# Patient Record
Sex: Female | Born: 1935 | Race: White | Hispanic: No | Smoking: Never smoker
Health system: Southern US, Community
[De-identification: ages and names within clinical notes are randomized; demographics above are authoritative.]

## PROBLEM LIST (undated history)

## (undated) DIAGNOSIS — E119 Type 2 diabetes mellitus without complications: Secondary | ICD-10-CM

---

## 2020-05-29 ENCOUNTER — Other Ambulatory Visit: Payer: Self-pay

## 2020-05-29 ENCOUNTER — Emergency Department (HOSPITAL_COMMUNITY)
Admission: EM | Admit: 2020-05-29 | Discharge: 2020-05-29 | Disposition: A | Discharge status: Home / Self Care | Payer: Medicare Other | Attending: Emergency Medicine | Admitting: Emergency Medicine

## 2020-05-29 ENCOUNTER — Emergency Department (HOSPITAL_COMMUNITY): Payer: Medicare Other

## 2020-05-29 ENCOUNTER — Encounter (HOSPITAL_COMMUNITY): Payer: Self-pay | Admitting: Emergency Medicine

## 2020-05-29 DIAGNOSIS — Z7984 Long term (current) use of oral hypoglycemic drugs: Secondary | ICD-10-CM | POA: Diagnosis not present

## 2020-05-29 DIAGNOSIS — R55 Syncope and collapse: Secondary | ICD-10-CM | POA: Diagnosis present

## 2020-05-29 DIAGNOSIS — E119 Type 2 diabetes mellitus without complications: Secondary | ICD-10-CM | POA: Diagnosis not present

## 2020-05-29 DIAGNOSIS — E871 Hypo-osmolality and hyponatremia: Secondary | ICD-10-CM | POA: Insufficient documentation

## 2020-05-29 HISTORY — DX: Type 2 diabetes mellitus without complications: E11.9

## 2020-05-29 LAB — COMPREHENSIVE METABOLIC PANEL
ALT: 20 U/L (ref 0–44)
AST: 30 U/L (ref 15–41)
Albumin: 3.2 g/dL — ABNORMAL LOW (ref 3.5–5.0)
Alkaline Phosphatase: 48 U/L (ref 38–126)
Anion gap: 9 (ref 5–15)
BUN: 20 mg/dL (ref 8–23)
CO2: 21 mmol/L — ABNORMAL LOW (ref 22–32)
Calcium: 8.1 mg/dL — ABNORMAL LOW (ref 8.9–10.3)
Chloride: 97 mmol/L — ABNORMAL LOW (ref 98–111)
Creatinine, Ser: 1.11 mg/dL — ABNORMAL HIGH (ref 0.44–1.00)
GFR, Estimated: 49 mL/min — ABNORMAL LOW (ref >60)
Glucose, Bld: 187 mg/dL — ABNORMAL HIGH (ref 70–99)
Potassium: 3.9 mmol/L (ref 3.5–5.1)
Sodium: 127 mmol/L — ABNORMAL LOW (ref 135–145)
Total Bilirubin: <0.1 mg/dL — ABNORMAL LOW (ref 0.3–1.2)
Total Protein: 6.7 g/dL (ref 6.5–8.1)

## 2020-05-29 LAB — CBC WITH DIFFERENTIAL/PLATELET
Abs Immature Granulocytes: 0 10*3/uL (ref 0.00–0.07)
Basophils Absolute: 0 10*3/uL (ref 0.0–0.1)
Basophils Relative: 0 %
Eosinophils Absolute: 0 10*3/uL (ref 0.0–0.5)
Eosinophils Relative: 0 %
HCT: 36.8 % (ref 36.0–46.0)
Hemoglobin: 12.6 g/dL (ref 12.0–15.0)
Immature Granulocytes: 0 %
Lymphocytes Relative: 16 %
Lymphs Abs: 0.6 10*3/uL — ABNORMAL LOW (ref 0.7–4.0)
MCH: 29.5 pg (ref 26.0–34.0)
MCHC: 34.2 g/dL (ref 30.0–36.0)
MCV: 86.2 fL (ref 80.0–100.0)
Monocytes Absolute: 0.2 10*3/uL (ref 0.1–1.0)
Monocytes Relative: 7 %
Neutro Abs: 2.7 10*3/uL (ref 1.7–7.7)
Neutrophils Relative %: 77 %
Platelets: 170 10*3/uL (ref 150–400)
RBC: 4.27 MIL/uL (ref 3.87–5.11)
RDW: 12.8 % (ref 11.5–15.5)
WBC: 3.5 10*3/uL — ABNORMAL LOW (ref 4.0–10.5)
nRBC: 0 % (ref 0.0–0.2)

## 2020-05-29 LAB — URINALYSIS, ROUTINE W REFLEX MICROSCOPIC
Bilirubin Urine: NEGATIVE
Glucose, UA: 50 mg/dL — AB
Ketones, ur: NEGATIVE mg/dL
Leukocytes,Ua: NEGATIVE
Nitrite: NEGATIVE
Protein, ur: NEGATIVE mg/dL
Specific Gravity, Urine: 1.011 (ref 1.005–1.030)
pH: 6 (ref 5.0–8.0)

## 2020-05-29 LAB — TROPONIN I (HIGH SENSITIVITY)
Troponin I (High Sensitivity): 5 ng/L (ref <18)
Troponin I (High Sensitivity): 7 ng/L (ref <18)

## 2020-05-29 MED ORDER — SODIUM CHLORIDE 0.9 % IV SOLN: INTRAVENOUS | Status: DC

## 2020-05-29 MED ORDER — SODIUM CHLORIDE 0.9 % IV BOLUS
500.0000 mL | Freq: Once | INTRAVENOUS | Status: AC
  Administered 2020-05-29: 500 mL via INTRAVENOUS
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
## 2020-05-29 NOTE — ED Notes (Signed)
Sonya Jones [phone]

## 2020-05-29 NOTE — ED Notes (Signed)
Pt. Refused the covid swab.

## 2020-05-29 NOTE — Discharge Instructions (Addendum)
Work-up for the near syncopal episode without any significant findings other than some mild hyponatremia.  This probably got corrected with the IV fluids here.  COVID testing is done and is pending results should be on MyChart tomorrow.  Would recommend that she have follow-up with her primary care doctor in about a week to have her basic metabolic panel repeated.  Blood sugar up a little bit today but nothing of real concern.  It was 180.  Return for any new or worse symptoms.  Return for passing out.

## 2020-05-29 NOTE — ED Triage Notes (Signed)
Pt from home via [facility]. Pt reports "almost passing out prior to using the bathroom." Per family pt has "another episode after sing the bathroom." Per Ems pt HR was 42 en oute and BP in the 80s.

## 2021-12-03 IMAGING — DX DG CHEST 1V PORT
1 series · 1 of 1 positions shown · non-contrast
Comparison: None.

CLINICAL DATA: Near syncope

EXAM:
PORTABLE CHEST 1 VIEW

[chest ap]
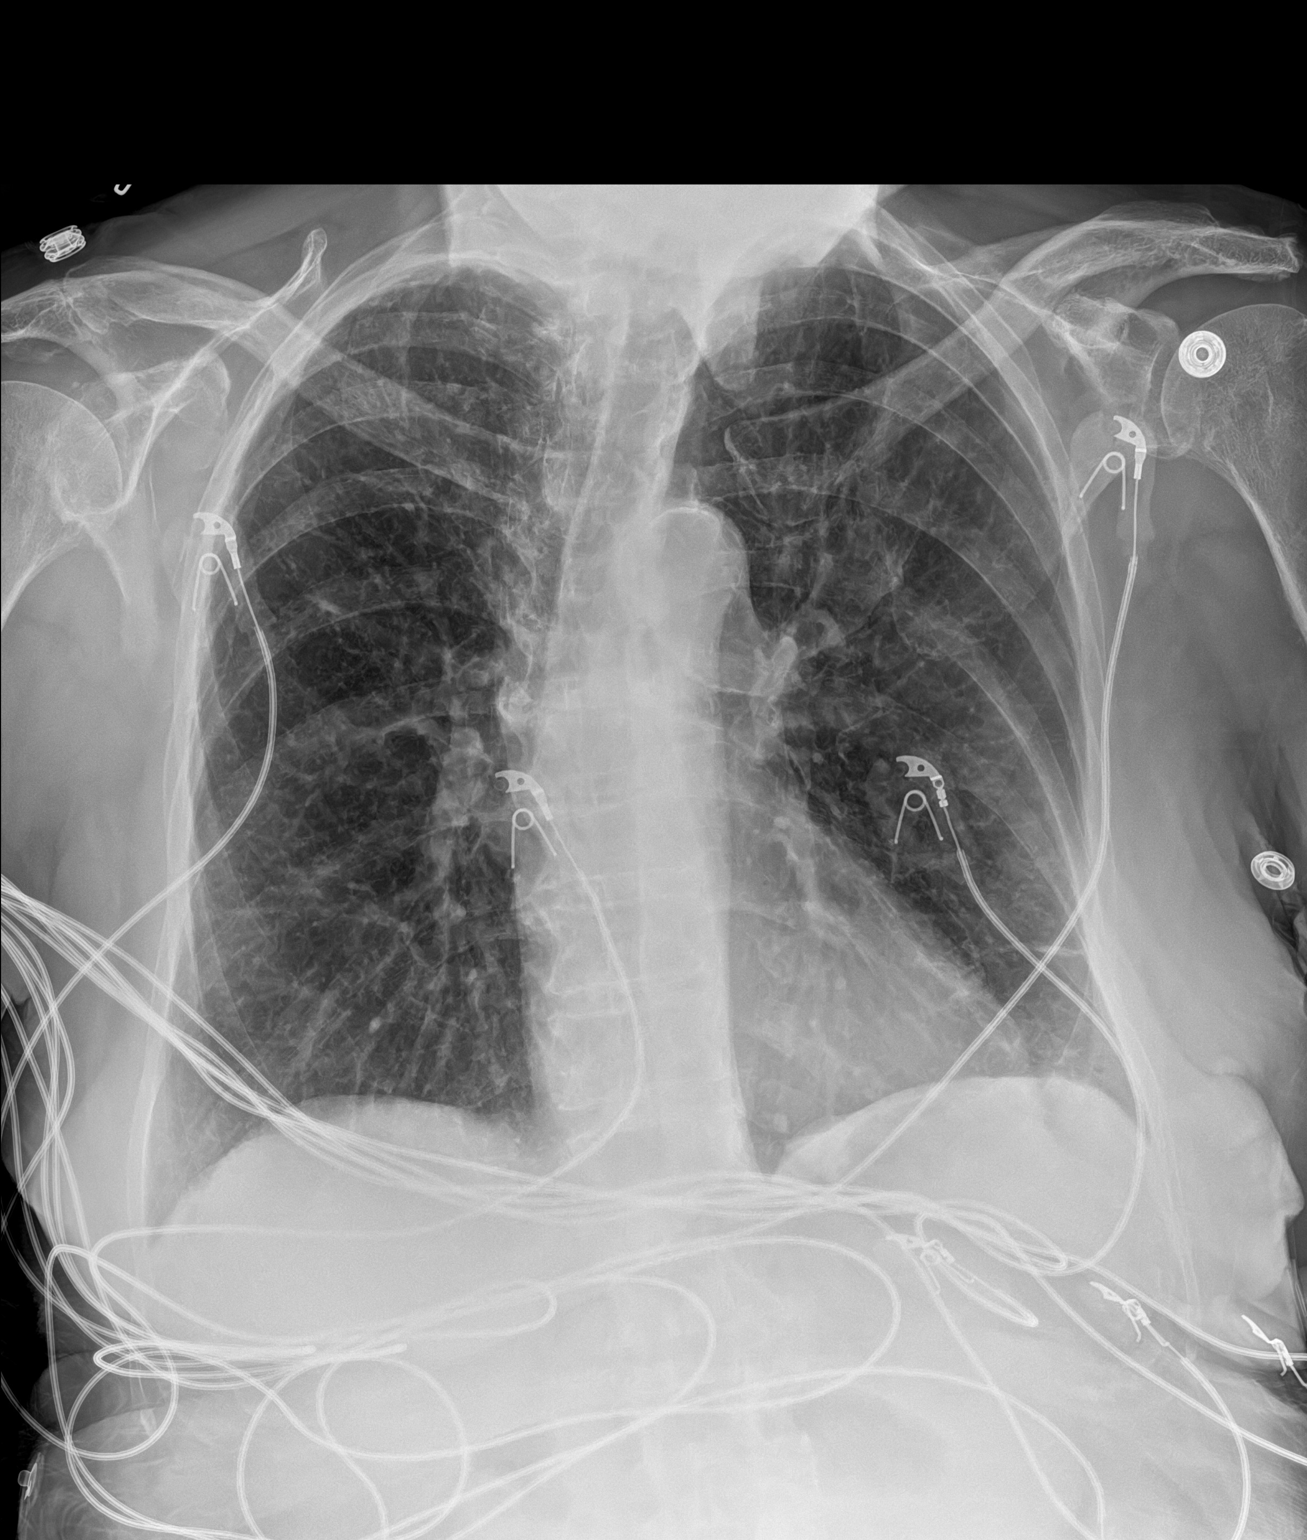

[1 of 1 positions shown; findings below may reference images not displayed]

FINDINGS: No focal opacity or pleural effusion. Normal cardiomediastinal
silhouette with aortic atherosclerosis. No pneumothorax.
IMPRESSION: No active disease.

## 2022-02-26 ENCOUNTER — Emergency Department: Payer: Medicare Other

## 2022-02-26 ENCOUNTER — Inpatient Hospital Stay
Admission: EM | Admit: 2022-02-26 | Discharge: 2022-03-03 | DRG: 481 | Disposition: A | Discharge status: Skilled Nursing Facility | Payer: Medicare Other | Attending: Internal Medicine | Admitting: Internal Medicine

## 2022-02-26 DIAGNOSIS — D62 Acute posthemorrhagic anemia: Secondary | ICD-10-CM | POA: Diagnosis not present

## 2022-02-26 DIAGNOSIS — F0150 Vascular dementia without behavioral disturbance: Secondary | ICD-10-CM | POA: Diagnosis present

## 2022-02-26 DIAGNOSIS — W1830XA Fall on same level, unspecified, initial encounter: Secondary | ICD-10-CM | POA: Diagnosis present

## 2022-02-26 DIAGNOSIS — I10 Essential (primary) hypertension: Secondary | ICD-10-CM | POA: Diagnosis present

## 2022-02-26 DIAGNOSIS — Z20822 Contact with and (suspected) exposure to covid-19: Secondary | ICD-10-CM | POA: Diagnosis present

## 2022-02-26 DIAGNOSIS — R339 Retention of urine, unspecified: Secondary | ICD-10-CM | POA: Diagnosis not present

## 2022-02-26 DIAGNOSIS — Z7901 Long term (current) use of anticoagulants: Secondary | ICD-10-CM

## 2022-02-26 DIAGNOSIS — S72001A Fracture of unspecified part of neck of right femur, initial encounter for closed fracture: Secondary | ICD-10-CM | POA: Diagnosis present

## 2022-02-26 DIAGNOSIS — F0390 Unspecified dementia without behavioral disturbance: Secondary | ICD-10-CM

## 2022-02-26 DIAGNOSIS — Y92129 Unspecified place in nursing home as the place of occurrence of the external cause: Secondary | ICD-10-CM

## 2022-02-26 DIAGNOSIS — Z79899 Other long term (current) drug therapy: Secondary | ICD-10-CM | POA: Diagnosis not present

## 2022-02-26 DIAGNOSIS — M810 Age-related osteoporosis without current pathological fracture: Secondary | ICD-10-CM | POA: Diagnosis present

## 2022-02-26 DIAGNOSIS — Z86718 Personal history of other venous thrombosis and embolism: Secondary | ICD-10-CM

## 2022-02-26 DIAGNOSIS — E1165 Type 2 diabetes mellitus with hyperglycemia: Secondary | ICD-10-CM

## 2022-02-26 DIAGNOSIS — S72011A Unspecified intracapsular fracture of right femur, initial encounter for closed fracture: Secondary | ICD-10-CM | POA: Diagnosis present

## 2022-02-26 DIAGNOSIS — W19XXXA Unspecified fall, initial encounter: Secondary | ICD-10-CM

## 2022-02-26 DIAGNOSIS — L8990 Pressure ulcer of unspecified site, unspecified stage: Secondary | ICD-10-CM | POA: Insufficient documentation

## 2022-02-26 LAB — URINALYSIS, ROUTINE W REFLEX MICROSCOPIC
Bacteria, UA: NONE SEEN
Bilirubin Urine: NEGATIVE
Glucose, UA: >=500 mg/dL — AB
Ketones, ur: NEGATIVE mg/dL
Leukocytes,Ua: NEGATIVE
Nitrite: NEGATIVE
Protein, ur: NEGATIVE mg/dL
Specific Gravity, Urine: 1.021 (ref 1.005–1.030)
pH: 5 (ref 5.0–8.0)

## 2022-02-26 LAB — PROTIME-INR
INR: 1.9 — ABNORMAL HIGH (ref 0.8–1.2)
Prothrombin Time: 21.5 seconds — ABNORMAL HIGH (ref 11.4–15.2)

## 2022-02-26 LAB — CBC WITH DIFFERENTIAL/PLATELET
Abs Immature Granulocytes: 0.02 10*3/uL (ref 0.00–0.07)
Basophils Absolute: 0.1 10*3/uL (ref 0.0–0.1)
Basophils Relative: 1 %
Eosinophils Absolute: 0.1 10*3/uL (ref 0.0–0.5)
Eosinophils Relative: 1 %
HCT: 39.4 % (ref 36.0–46.0)
Hemoglobin: 12.9 g/dL (ref 12.0–15.0)
Immature Granulocytes: 0 %
Lymphocytes Relative: 8 %
Lymphs Abs: 0.7 10*3/uL (ref 0.7–4.0)
MCH: 29.7 pg (ref 26.0–34.0)
MCHC: 32.7 g/dL (ref 30.0–36.0)
MCV: 90.8 fL (ref 80.0–100.0)
Monocytes Absolute: 0.6 10*3/uL (ref 0.1–1.0)
Monocytes Relative: 6 %
Neutro Abs: 7.9 10*3/uL — ABNORMAL HIGH (ref 1.7–7.7)
Neutrophils Relative %: 84 %
Platelets: 221 10*3/uL (ref 150–400)
RBC: 4.34 MIL/uL (ref 3.87–5.11)
RDW: 12.6 % (ref 11.5–15.5)
WBC: 9.4 10*3/uL (ref 4.0–10.5)
nRBC: 0 % (ref 0.0–0.2)

## 2022-02-26 LAB — BASIC METABOLIC PANEL
Anion gap: 11 (ref 5–15)
BUN: 32 mg/dL — ABNORMAL HIGH (ref 8–23)
CO2: 20 mmol/L — ABNORMAL LOW (ref 22–32)
Calcium: 9.2 mg/dL (ref 8.9–10.3)
Chloride: 106 mmol/L (ref 98–111)
Creatinine, Ser: 1.26 mg/dL — ABNORMAL HIGH (ref 0.44–1.00)
GFR, Estimated: 41 mL/min — ABNORMAL LOW (ref >60)
Glucose, Bld: 343 mg/dL — ABNORMAL HIGH (ref 70–99)
Potassium: 3.6 mmol/L (ref 3.5–5.1)
Sodium: 137 mmol/L (ref 135–145)

## 2022-02-26 LAB — RESP PANEL BY RT-PCR (FLU A&B, COVID) ARPGX2
Influenza A by PCR: NEGATIVE
Influenza B by PCR: NEGATIVE
SARS Coronavirus 2 by RT PCR: NEGATIVE

## 2022-02-26 LAB — TYPE AND SCREEN

## 2022-02-26 MED ORDER — TRAZODONE HCL 50 MG PO TABS
25.0000 mg | ORAL_TABLET | Freq: Every evening | ORAL | Status: DC | PRN
  Administered 2022-02-27 – 2022-03-02 (×4): 25 mg via ORAL
  Filled 2022-02-26 (×4): qty 1

## 2022-02-26 MED ORDER — ACETAMINOPHEN 325 MG PO TABS: 650.0000 mg | ORAL_TABLET | ORAL | Status: DC | PRN

## 2022-02-26 MED ORDER — CHLORHEXIDINE GLUCONATE 4 % EX LIQD: Freq: Once | CUTANEOUS | Status: AC

## 2022-02-26 MED ORDER — DONEPEZIL HCL 5 MG PO TABS
5.0000 mg | ORAL_TABLET | Freq: Every day | ORAL | Status: DC
  Administered 2022-02-26 – 2022-03-02 (×5): 5 mg via ORAL
  Filled 2022-02-26 (×5): qty 1

## 2022-02-26 MED ORDER — LORAZEPAM 1 MG PO TABS: 1.0000 mg | ORAL_TABLET | Freq: Once | ORAL | Status: DC

## 2022-02-26 MED ORDER — MORPHINE SULFATE (PF) 2 MG/ML IV SOLN: 0.5000 mg | INTRAVENOUS | Status: DC | PRN

## 2022-02-26 MED ORDER — HYDROCODONE-ACETAMINOPHEN 5-325 MG PO TABS: 1.0000 | ORAL_TABLET | Freq: Four times a day (QID) | ORAL | Status: DC | PRN

## 2022-02-26 MED ORDER — METOPROLOL TARTRATE 25 MG PO TABS
25.0000 mg | ORAL_TABLET | Freq: Two times a day (BID) | ORAL | Status: DC
  Administered 2022-02-26 – 2022-03-03 (×9): 25 mg via ORAL
  Filled 2022-02-26 (×10): qty 1

## 2022-02-26 MED ORDER — MEMANTINE HCL 5 MG PO TABS
5.0000 mg | ORAL_TABLET | Freq: Two times a day (BID) | ORAL | Status: DC
  Administered 2022-02-26 – 2022-03-03 (×9): 5 mg via ORAL
  Filled 2022-02-26 (×10): qty 1

## 2022-02-26 MED ORDER — POTASSIUM CHLORIDE IN NACL 20-0.9 MEQ/L-% IV SOLN
INTRAVENOUS | Status: DC
  Filled 2022-02-26 (×2): qty 1000

## 2022-02-26 MED ORDER — SODIUM CHLORIDE 0.9 % IV SOLN: INTRAVENOUS | Status: DC

## 2022-02-26 MED ORDER — MAGNESIUM HYDROXIDE 400 MG/5ML PO SUSP: 30.0000 mL | Freq: Every day | ORAL | Status: DC | PRN

## 2022-02-26 MED ORDER — HYDROCODONE-ACETAMINOPHEN 5-325 MG PO TABS: 1.0000 | ORAL_TABLET | ORAL | Status: DC | PRN

## 2022-02-26 MED ORDER — ONDANSETRON HCL 4 MG/2ML IJ SOLN: 4.0000 mg | INTRAMUSCULAR | Status: DC | PRN

## 2022-02-26 MED ORDER — CEFAZOLIN SODIUM-DEXTROSE 2-4 GM/100ML-% IV SOLN
2.0000 g | INTRAVENOUS | Status: AC
  Administered 2022-02-27: 2 g via INTRAVENOUS
  Filled 2022-02-26: qty 100

## 2022-02-26 MED ORDER — LORAZEPAM 2 MG/ML IJ SOLN
0.5000 mg | Freq: Once | INTRAMUSCULAR | Status: AC
  Administered 2022-02-26: 0.5 mg via INTRAVENOUS
  Filled 2022-02-26: qty 1

## 2022-02-26 NOTE — Assessment & Plan Note (Signed)
-   We will have to temporarily hold off Eliquis for now for expected operative intervention tomorrow. - We will defer resumption of Eliquis to Dr. Ammie Ferrier discretion, especially given recent onset of acute DVT.Marland Kitchen

## 2022-02-26 NOTE — Assessment & Plan Note (Signed)
We will continue- We will continue her antihypertensives.

## 2022-02-26 NOTE — ED Provider Notes (Addendum)
----------------------------------------- 8:24 PM on 02/26/2022 -----------------------------------------  Blood pressure (!) 151/65, pulse 80, temperature 98.6 F (37 C), temperature source Oral, resp. rate 18, SpO2 95 %.  Assuming care from Dr. Griffin Dakin, PA-C/NP-C.  In short, Sonya Jones is a 88 y.o. female with a chief complaint of Hip Injury (Resident from [facility] [place] was brought in by EMS for an acute RIGHT femoral neck fracture (history obtained from EMS because patient has vascular dementia); Patient was bending over to pick up a book when she fell this morning; Imaging was obtained at the SNF which showed the fracture) .  Refer to the original H&P for additional details.  The current plan of care is to await CT results to confirm hip fracture and admit to the hospital service for surgical intervention in the morning.  ____________________________________________    ED Results / Procedures / Treatments   Labs (all labs ordered are listed, but only abnormal results are displayed) Labs Reviewed  CBC WITH DIFFERENTIAL/PLATELET - Abnormal; Notable for the following components:      Result Value   Neutro Abs 7.9 (*)    All other components within normal limits  BASIC METABOLIC PANEL - Abnormal; Notable for the following components:   CO2 20 (*)    Glucose, Bld 343 (*)    BUN 32 (*)    Creatinine, Ser 1.26 (*)    GFR, Estimated 41 (*)    All other components within normal limits  PROTIME-INR - Abnormal; Notable for the following components:   Prothrombin Time 21.5 (*)    INR 1.9 (*)    All other components within normal limits  RESP PANEL BY RT-PCR (FLU A&B, COVID) [id number]  URINALYSIS, ROUTINE W REFLEX MICROSCOPIC     EKG     RADIOLOGY  I personally viewed and evaluated these images as part of my medical decision making, as well as reviewing the written report by the radiologist.  ED Provider Interpretation: Acute impacted right femoral neck fracture  noted on plain films  CT Hip Right Wo Contrast  Result Date: 02/26/2022 CLINICAL DATA:  Mechanical fall EXAM: CT OF THE RIGHT HIP WITHOUT CONTRAST TECHNIQUE: Multidetector CT imaging of the right hip was performed according to the standard protocol. Multiplanar CT image reconstructions were also generated. RADIATION DOSE REDUCTION: This exam was performed according to the departmental dose-optimization program which includes automated exposure control, adjustment of the mA and/or kV according to patient size and/or use of iterative reconstruction technique. COMPARISON:  Radiograph 02/26/2022 FINDINGS: Bones/Joint/Cartilage Acute mildly impacted subcapital right femoral neck fracture. No femoral head dislocation. Mild degenerative change of the right hip with joint space narrowing and spurring. No sizable hip effusion. Ligaments Suboptimally assessed by CT. Muscles and Tendons No intramuscular collections.  No significant atrophy. Soft tissues Edema within the right hip soft tissues. Slightly dense masses within the subcutaneous soft tissues of the lateral right hip measuring 5.4 by 4 cm consistent with hematoma. IMPRESSION: 1. Acute mildly impacted subcapital right femoral neck fracture with normal femoral head alignment 2. 5.4 cm hematoma within the subcutaneous soft tissues of the lateral right hip. Electronically Signed   By: Jasmine Pang M.D.   On: 02/26/2022 19:46   DG Hip Unilat W or Wo Pelvis 2-3 Views Right  Result Date: 02/26/2022 CLINICAL DATA:  Fall.  Evaluate for right hip fracture. EXAM: DG HIP (WITH OR WITHOUT PELVIS) 2-3V RIGHT COMPARISON:  CT abdomen and pelvis 02/16/2022 FINDINGS: There is diffuse decreased bone mineralization. Moderate  bilateral sacroiliac subchondral sclerosis. Mild bilateral superior femoroacetabular joint space narrowing and peripheral acetabular degenerative osteophytes. There is new acute angulation and mild cortical step-off of the lateral right femoral head-neck  junction indicating an acute proximal femoral neck fracture. Minimal medial displacement of the right femoral neck with respect of the right femoral head. Severe right and moderate left L4-5 disc space narrowing and peripheral endplate osteophytes. IMPRESSION: 1. Acute, mildly displaced right femoral neck fracture. 2. Severe right and moderate left L4-5 degenerative disc and facet changes. Electronically Signed   By: Neita Garnet M.D.   On: 02/26/2022 17:05   DG Wrist Complete Left  Result Date: 02/26/2022 CLINICAL DATA:  Fall EXAM: LEFT WRIST - COMPLETE 3+ VIEW COMPARISON:  None Available. FINDINGS: Advanced degenerative changes at the 1st carpometacarpal joint. Moderate arthritic changes in the radiocarpal joint. No acute bony abnormality. Specifically, no fracture, subluxation, or dislocation. IMPRESSION: No acute bony abnormality. Electronically Signed   By: Charlett Nose M.D.   On: 02/26/2022 17:05   DG Chest 1 View  Result Date: 02/26/2022 CLINICAL DATA:  Fall, right hip fracture EXAM: CHEST  1 VIEW COMPARISON:  02/16/2022 FINDINGS: Heart and mediastinal contours are within normal limits. No focal opacities or effusions. No acute bony abnormality. IMPRESSION: No active disease. Electronically Signed   By: Charlett Nose M.D.   On: 02/26/2022 17:04   CT HEAD WO CONTRAST ( )  Result Date: 02/26/2022 CLINICAL DATA:  Dementia, fall EXAM: CT HEAD WITHOUT CONTRAST CT CERVICAL SPINE WITHOUT CONTRAST TECHNIQUE: Multidetector CT imaging of the head and cervical spine was performed following the standard protocol without intravenous contrast. Multiplanar CT image reconstructions of the cervical spine were also generated. RADIATION DOSE REDUCTION: This exam was performed according to the departmental dose-optimization program which includes automated exposure control, adjustment of the mA and/or kV according to patient size and/or use of iterative reconstruction technique. COMPARISON:  CT brain 02/16/2022  FINDINGS: CT HEAD FINDINGS Brain: No acute territorial infarction, hemorrhage or intracranial mass. Atrophy. Patchy white matter hypodensity consistent with chronic small vessel ischemic change. Stable ventricle size. Vascular: No hyperdense vessels. Vertebral and carotid vascular calcification Skull: Normal. Negative for fracture or focal lesion. Sinuses/Orbits: No acute finding. Other: None CT CERVICAL SPINE FINDINGS Alignment: No subluxation.  Facet alignment is within normal limits. Skull base and vertebrae: No acute fracture. No primary bone lesion or focal pathologic process. Soft tissues and spinal canal: No prevertebral fluid or swelling. No visible canal hematoma. Disc levels: Multilevel degenerative change. Moderate severe disc space narrowing C5-C6 and C6-C7 with moderate disc space narrowing at C7-T1. Hypertrophic facet degenerative changes left greater than right at multiple levels with foraminal narrowing. Upper chest: Negative. Other: None IMPRESSION: 1. No CT evidence for acute intracranial abnormality. Atrophy and chronic small vessel ischemic changes of the white matter 2. Multilevel degenerative changes of the cervical spine. No acute osseous abnormality Electronically Signed   By: Jasmine Pang M.D.   On: 02/26/2022 16:46   CT Cervical Spine Wo Contrast  Result Date: 02/26/2022 CLINICAL DATA:  Dementia, fall EXAM: CT HEAD WITHOUT CONTRAST CT CERVICAL SPINE WITHOUT CONTRAST TECHNIQUE: Multidetector CT imaging of the head and cervical spine was performed following the standard protocol without intravenous contrast. Multiplanar CT image reconstructions of the cervical spine were also generated. RADIATION DOSE REDUCTION: This exam was performed according to the departmental dose-optimization program which includes automated exposure control, adjustment of the mA and/or kV according to patient size and/or use of iterative reconstruction technique. COMPARISON:  CT brain 02/16/2022 FINDINGS: CT  HEAD FINDINGS Brain: No acute territorial infarction, hemorrhage or intracranial mass. Atrophy. Patchy white matter hypodensity consistent with chronic small vessel ischemic change. Stable ventricle size. Vascular: No hyperdense vessels. Vertebral and carotid vascular calcification Skull: Normal. Negative for fracture or focal lesion. Sinuses/Orbits: No acute finding. Other: None CT CERVICAL SPINE FINDINGS Alignment: No subluxation.  Facet alignment is within normal limits. Skull base and vertebrae: No acute fracture. No primary bone lesion or focal pathologic process. Soft tissues and spinal canal: No prevertebral fluid or swelling. No visible canal hematoma. Disc levels: Multilevel degenerative change. Moderate severe disc space narrowing C5-C6 and C6-C7 with moderate disc space narrowing at C7-T1. Hypertrophic facet degenerative changes left greater than right at multiple levels with foraminal narrowing. Upper chest: Negative. Other: None IMPRESSION: 1. No CT evidence for acute intracranial abnormality. Atrophy and chronic small vessel ischemic changes of the white matter 2. Multilevel degenerative changes of the cervical spine. No acute osseous abnormality Electronically Signed   By: Donavan Foil M.D.   On: 02/26/2022 16:46     PROCEDURES:  Critical Care performed: No  Procedures   MEDICATIONS ORDERED IN ED: Medications  LORazepam (ATIVAN) injection 0.5 mg (0.5 mg Intravenous Given 02/26/22 2021)     IMPRESSION / MDM / ASSESSMENT AND PLAN / ED COURSE  I reviewed the triage vital signs and the nursing notes.                              Differential diagnosis includes, but is not limited to, hip contusion, SDH, cervical spine fracture, hip fracture, hip dislocation  Patient's presentation is most consistent with acute complicated illness / injury requiring diagnostic workup.  Geriatric patient to the ED for evaluation of injury sustained following a witnessed mechanical fall at her  facility.  She presents in no acute distress.  Patient has been acting alert the whole time.  Imaging performed including CT of the head and neck were negative for any acute findings.  Initial chest and wrist, and hip films did confirm an impacted right femoral neck fracture.  Further evaluation of the right hip with CT imaging also confirms the acute hip fracture.  Remaining images are negative and reassuring, based on my review of all images.  Patient's diagnosis is consistent with mechanical fall resulting in right hip fracture. Patient will be mated to the hospital service for planned surgical intervention in the morning. Patient is to follow up with Dr. Earnestine Leys the Ortho service who will evaluate the patient in the morning.   ----------------------------------------- 9:26 PM on 02/26/2022 ----------------------------------------- Patient admitted to the hospital service by Dr. Eugenie Norrie.  I assisted the RN at bedside with several initial unsuccessful attempts to place Foley catheters including a Extina [place], 14 [place], and finally successful with an 8 [place] pediatric Foley catheter.  She was transferred to the main ED at this time for continuous monitoring as she is a fall risk, with dementia, and constant attempts to get out of bed.   Clinical Course as of 02/26/22 2029  Fri Feb 26, 2022  [id number] Orthopedics paged [JP]  [id number] Dr. Sabra Heck looked at the films, would like to obtain CT hip for confirmation prior to admission.  If CT confirms fracture, then plan for OR pinning tomorrow [JP]  1922 Passed off to Cruzita Lederer, PA-C [JP]    Clinical Course User Index [JP] Poggi, Clarnce Flock, PA-C  FINAL CLINICAL IMPRESSION(S) / ED DIAGNOSES   Final diagnoses:  Closed fracture of right hip, initial encounter [facility])  Fall, initial encounter     Rx / DC Orders   ED Discharge Orders     None        Note:  This document was prepared using Dragon voice recognition software and may include  unintentional dictation errors.    Lissa Hoard, PA-C 02/26/22 2029    Lissa Hoard, PA-C 02/26/22 2127    Corena Herter, MD 02/26/22 2250

## 2022-02-26 NOTE — Assessment & Plan Note (Signed)
-   We will continue Aricept and Namenda. 

## 2022-02-26 NOTE — ED Notes (Signed)
Report given to [facility]. Folely catheter has drained 243ml of clear, light yellow urine. Family at bedside.

## 2022-02-26 NOTE — ED Provider Triage Note (Signed)
Emergency Medicine Provider Triage Evaluation Note  Pernell Dikes, a 88 y.o. female with a history of diabetes and dementia, was evaluated in triage.  Pt complains of mechanical fall resulting in right hip fracture and concern for a left wrist injury. She presents via EMS from her facility, where she had a witnessed fall this morning at about 8 AM.  Patient apparently took out of her wheelchair, landing on her right hip reported LOC.  Review of Systems  Positive: Right hip fracture, left wrist injury Negative: LOC  Physical Exam  There were no vitals taken for this visit. Gen:   Awake, no distress  NAD Resp:  Normal effort DTA MSK:   Moves extremities without difficulty Shortening of right LE Other:    Medical Decision Making  Medically screening exam initiated at 4:05 PM.  Appropriate orders placed.  Willadean Guyton was informed that the remainder of the evaluation will be completed by another provider, this initial triage assessment does not replace that evaluation, and the importance of remaining in the ED until their evaluation is complete.  Geriatric patient to the ED via EMS from her facility, after a witnessed fall this morning.  Patient apparently had films performed at the facility confirming a right hip fracture, and possible left wrist injury.  No other injuries reported at this time.   Melvenia Needles, PA-C 02/26/22 1609

## 2022-02-26 NOTE — ED Provider Notes (Signed)
[facility] Provider Note    Event Date/Time   First MD Initiated Contact with Patient 02/26/22 1811     (approximate)   History   Hip Injury (Resident from [place] was brought in by EMS for an acute RIGHT femoral neck fracture (history obtained from EMS because patient has vascular dementia); Patient was bending over to pick up a book when she fell this morning; Imaging was obtained at the SNF which showed the fracture)   HPI  Sonya Jones is a 88 y.o. female with a past medical history of vascular dementia, hypertension, DVT on Eliquis who presents today from SNF for a hip fracture after films obtained at SNF.  Patient reportedly bent over to pick up a book when she fell onto her right hip.  X-ray obtained at the SNF demonstrated a fracture.  Unclear if patient had loss of consciousness or head strike.  She currently feels like her pain is controlled.  There are no problems to display for this patient.           Physical Exam   Triage Vital Signs: ED Triage Vitals [02/26/22 1611]  Enc Vitals Group     BP (!) 151/65     Pulse Rate 80     Resp 18     Temp 98.6 F (37 C)     Temp Source Oral     SpO2 95 %     Weight      Height      Head Circumference      Peak Flow      Pain Score      Pain Loc      Pain Edu?      Excl. in [place]?     Most recent vital signs: Vitals:   02/26/22 1611  BP: (!) 151/65  Pulse: 80  Resp: 18  Temp: 98.6 F (37 C)  SpO2: 95%    Physical Exam Vitals and nursing note reviewed.  Constitutional:      General: Awake and alert. No acute distress.    Appearance: Normal appearance. The patient is normal weight.  HENT:     Head: Normocephalic and atraumatic.     Mouth: Mucous membranes are moist.  Eyes:     General: PERRL. Normal EOMs        Right eye: No discharge.        Left eye: No discharge.     Conjunctiva/sclera: Conjunctivae normal.  Cardiovascular:     Rate and Rhythm: Normal  rate.     Pulses: Normal pulses.  Pulmonary:     Effort: Pulmonary effort is normal. No respiratory distress.     Breath sounds: Normal breath sounds.  No chest wall ecchymosis Abdominal:     Abdomen is soft. There is no abdominal tenderness. No rebound or guarding. No distention.  No abdominal ecchymosis Musculoskeletal:        General: No swelling. Normal range of motion.     Cervical back: Normal range of motion and neck supple.  Pelvis stable: Pain with logroll of her right hip.  Posterior ecchymosis with hematoma over her lateral right hip.  Normal distal pulses.  Sensation intact light touch throughout her lower extremity.  No evidence of phlegmasia or cerulea dolens. No pitting edema.  Sensation intact distally Skin:    General: Skin is warm and dry.     Capillary Refill: Capillary refill takes less than 2 seconds.  Neurological:  Mental Status: The patient is awake and alert.  At her mental baseline     ED Results / Procedures / Treatments   Labs (all labs ordered are listed, but only abnormal results are displayed) Labs Reviewed  CBC WITH DIFFERENTIAL/PLATELET - Abnormal; Notable for the following components:      Result Value   Neutro Abs 7.9 (*)    All other components within normal limits  BASIC METABOLIC PANEL - Abnormal; Notable for the following components:   CO2 20 (*)    Glucose, Bld 343 (*)    BUN 32 (*)    Creatinine, Ser 1.26 (*)    GFR, Estimated 41 (*)    All other components within normal limits  PROTIME-INR - Abnormal; Notable for the following components:   Prothrombin Time 21.5 (*)    INR 1.9 (*)    All other components within normal limits  RESP PANEL BY RT-PCR (FLU A&B, COVID) ARPGX2  URINALYSIS, ROUTINE W REFLEX MICROSCOPIC     EKG     RADIOLOGY I independently reviewed and interpreted imaging and agree with radiologists findings.     PROCEDURES:  Critical Care performed:   Procedures   MEDICATIONS ORDERED IN  ED: Medications - No data to display   IMPRESSION / MDM / ASSESSMENT AND PLAN / ED COURSE  I reviewed the triage vital signs and the nursing notes.   Differential diagnosis includes, but is not limited to, hip fracture, hip dislocation, contusion, concurrent injury.  Patient is awake and alert, hemodynamically stable and afebrile.  She is neurovascularly intact.  X-ray obtained in triage demonstrates an acute mildly displaced right femoral neck fracture.  X-ray of her wrist, chest, and CT of her head and neck were also obtained in triage and are overall reassuring.  Patient is on Eliquis for DVT.  She has no appreciable lower extremity pitting edema, and no findings suggestive of leg nausea or cerulea dolens. No CP/SOB, pleurisy or tachycardia/hypoxia to suggest PE.  X-ray results were discussed with Dr. Hyacinth Meeker who reviewed the x-ray films.  He is requesting a CT pelvis to confirm fracture prior to admission.  If CT does indeed confirm a hip fracture, patient will be admitted to the hospitalist service.  Patient was passed off to [facility], PA-C pending CT of the hip.  Plan for admission to the hospitalist service with operative intervention tomorrow if CT is positive, likely discharge if CT is negative.  Patient's presentation is most consistent with acute presentation with potential threat to life or bodily function.   Clinical Course as of 02/26/22 1924  Fri Feb 26, 2022  [id number] Orthopedics paged [JP]  5621 Dr. Hyacinth Meeker looked at the films, would like to obtain CT hip for confirmation prior to admission.  If CT confirms fracture, then plan for OR pinning tomorrow [JP]  1922 Passed off to Dorris Carnes, PA-C [JP]    Clinical Course User Index [JP] Leno Mathes, Herb Grays, PA-C     FINAL CLINICAL IMPRESSION(S) / ED DIAGNOSES   Final diagnoses:  Closed fracture of right hip, initial encounter [facility])  Fall, initial encounter     Rx / DC Orders   ED Discharge Orders     None         Note:  This document was prepared using Dragon voice recognition software and may include unintentional dictation errors.   Keturah Shavers 02/26/22 1924    Jene Every, MD 02/26/22 Ninfa Linden    Jene Every, MD 02/26/22  2013  

## 2022-02-26 NOTE — Assessment & Plan Note (Addendum)
-   The patient will be placed on supplemental coverage with NovoLog. - We will hold off metformin. 

## 2022-02-26 NOTE — ED Notes (Signed)
Patient was incontinent to stool and urine in her brief. Patient c/o need to urinate. Patient was cleaned up, new brief in place and Purewick was placed. Patient has been trying to get off the stretcher. Patient placed in Trendelenburg, both rails are up. Patient is resistant to using the Purewick, states she wants to get up and use a "tin can." Patient's family is at bedside.

## 2022-02-26 NOTE — ED Triage Notes (Signed)
Resident from [place] was brought in by EMS for an acute RIGHT femoral neck fracture (history obtained from EMS because patient has vascular dementia); Patient was bending over to pick up a book when she fell this morning; Imaging was obtained at the SNF which showed the fracture

## 2022-02-26 NOTE — ED Notes (Signed)
ED tech Urban Gibson was at bedside during Foley catheter insertion due to patient's combativeness. Jenise Bacon-Menshew PA-C at bedside.

## 2022-02-26 NOTE — H&P (Signed)
[place]   PATIENT NAME: Sonya ConchMary Jones    MR#:  [id number]  DATE OF BIRTH:  1932/06/26  DATE OF ADMISSION:  02/26/2022  PRIMARY CARE PHYSICIAN: Normajean GlasgowPacifico, Micah, FNP   Patient is coming from: SNF  REQUESTING/REFERRING PHYSICIAN: Jene EveryKinner, Robert, MD  CHIEF COMPLAINT:   Chief Complaint  Patient presents with   Hip Injury    Resident from [facility] [place] was brought in by EMS for an acute RIGHT femoral neck fracture (history obtained from EMS because patient has vascular dementia); Patient was bending over to pick up a book when she fell this morning; Imaging was obtained at the SNF which showed the fracture    HISTORY OF PRESENT ILLNESS:  Sonya Jones is a 88 y.o. female with medical history significant for type 2 diabetes mellitus, vascular dementia, DVT on Eliquis who presented to the emergency room from her SNF with acute onset of accidental mechanical fall while she was bending over to pick up a book with subsequent fall on her right side and right hip pain.  X-ray was obtained and SNF revealing a fracture for which she was referred to the emergency room.  She denies any presyncope or syncope or head injury.  Her pain was fairly controlled here.  She denies any chest pain or palpitations.  No reported cough or wheezing hemoptysis.  No forward dysuria, oliguria or hematuria or flank pain.  No bleeding diathesis.  The patient is a very poor historian due to her dementia.  ED Course: Upon presentation to the emergency room, BP was 151/65 with otherwise normal vital signs.  Labs revealed a BUN of 32 with a creatinine 1.36, blood glucose of 343 with a CO2 of 20.  CBC was within normal.  INR is 1.9 and PTT 21.5.  COVID-19 PCR and influenza antigens came back negative.  EKG as reviewed by me : Pending. Imaging: Noncontrasted head CT scan revealed no acute intracranial abnormalities.  It showed atrophy and chronic small vessel ischemic changes of the white matter with multilevel  degenerative changes of the C-spine on her C-spine CT with no acute osseous abnormality.  Portable chest x-ray showed no acute cardiopulmonary disease.  Left wrist x-ray showed no acute bony abnormality.  Right hip CT revealed acute mildly impacted subcapital right femoral neck fracture with normal femoral head alignment.  Showed 5.4 cm hematoma within the subcutaneous soft tissue of the lateral right hip.  The patient was given 0.5 mg of IV Ativan.  Dr. Hyacinth MeekerMiller was notified about the patient.  She will be admitted to a medical-surgical bed for further evaluation and management. PAST MEDICAL HISTORY:   Past Medical History:  Diagnosis Date   Diabetes mellitus without complication (HCC)     PAST SURGICAL HISTORY:  No past surgical history on file.  No reported previous surgeries and otherwise unobtainable due to the patient's dementia.  SOCIAL HISTORY:   Social History   Tobacco Use   Smoking status: Never   Smokeless tobacco: Never  Substance Use Topics   Alcohol use: Not Currently    FAMILY HISTORY:  No family history on file. No reported familial diseases otherwise unobtainable due to the patient's dementia. DRUG ALLERGIES:  No Known Allergies  REVIEW OF SYSTEMS:   ROS As per history of present illness. All pertinent systems were reviewed above. Constitutional, HEENT, cardiovascular, respiratory, GI, GU, musculoskeletal, neuro, psychiatric, endocrine, integumentary and hematologic systems were reviewed and are otherwise negative/unremarkable except for positive findings mentioned above  in the HPI.   MEDICATIONS AT HOME:   Prior to Admission medications   Medication Sig Start Date End Date Taking? Authorizing Provider  Cholecalciferol (VITAMIN D3) 50 MCG (2000 UT) TABS Take 1 tablet by mouth daily. Patient not taking: Reported on 02/26/2022 02/07/20   [provider]  lisinopril (ZESTRIL) 5 MG tablet Take 5 mg by mouth daily. Patient not taking: Reported on  02/26/2022 03/21/20   [provider]  metFORMIN (GLUCOPHAGE) 500 MG tablet Take 500 mg by mouth 2 (two) times daily. Patient not taking: Reported on 02/26/2022 03/10/20   [provider]      VITAL SIGNS:  Blood pressure (!) 151/65, pulse 100, temperature 98.6 F (37 C), temperature source Oral, resp. rate 18, SpO2 95 %.  PHYSICAL EXAMINATION:  Physical Exam  GENERAL:  88 y.o.-year-old patient lying in the bed with no acute distress.  EYES: Pupils equal, round, reactive to light and accommodation. No scleral icterus. Extraocular muscles intact.  HEENT: Head atraumatic, normocephalic. Oropharynx and nasopharynx clear.  NECK:  Supple, no jugular venous distention. No thyroid enlargement, no tenderness.  LUNGS: Normal breath sounds bilaterally, no wheezing, rales,rhonchi or crepitation. No use of accessory muscles of respiration.  CARDIOVASCULAR: Regular rate and rhythm, S1, S2 normal. No murmurs, rubs, or gallops.  ABDOMEN: Soft, nondistended, nontender. Bowel sounds present. No organomegaly or mass.  EXTREMITIES: No pedal edema, cyanosis, or clubbing.  NEUROLOGIC: Cranial nerves II through XII are intact. Muscle strength 5/5 in all extremities. Sensation intact. Gait not checked. Musculoskeletal: Right lateral hip bruise and tenderness. PSYCHIATRIC: The patient is alert and oriented x 3.  Normal affect and good eye contact. SKIN: No obvious rash, lesion, or ulcer.   LABORATORY PANEL:   CBC Recent Labs  Lab 02/26/22 1740  WBC 9.4  HGB 12.9  HCT 39.4  PLT 221   ------------------------------------------------------------------------------------------------------------------  Chemistries  Recent Labs  Lab 02/26/22 1820  NA 137  K 3.6  CL 106  CO2 20*  GLUCOSE 343*  BUN 32*  CREATININE 1.26*  CALCIUM 9.2   ------------------------------------------------------------------------------------------------------------------  Cardiac Enzymes No results  for input(s): "TROPONINI" in the last 168 hours. ------------------------------------------------------------------------------------------------------------------  RADIOLOGY:  CT Hip Right Wo Contrast  Result Date: 02/26/2022 CLINICAL DATA:  Mechanical fall EXAM: CT OF THE RIGHT HIP WITHOUT CONTRAST TECHNIQUE: Multidetector CT imaging of the right hip was performed according to the standard protocol. Multiplanar CT image reconstructions were also generated. RADIATION DOSE REDUCTION: This exam was performed according to the departmental dose-optimization program which includes automated exposure control, adjustment of the mA and/or kV according to patient size and/or use of iterative reconstruction technique. COMPARISON:  Radiograph 02/26/2022 FINDINGS: Bones/Joint/Cartilage Acute mildly impacted subcapital right femoral neck fracture. No femoral head dislocation. Mild degenerative change of the right hip with joint space narrowing and spurring. No sizable hip effusion. Ligaments Suboptimally assessed by CT. Muscles and Tendons No intramuscular collections.  No significant atrophy. Soft tissues Edema within the right hip soft tissues. Slightly dense masses within the subcutaneous soft tissues of the lateral right hip measuring 5.4 by 4 cm consistent with hematoma. IMPRESSION: 1. Acute mildly impacted subcapital right femoral neck fracture with normal femoral head alignment 2. 5.4 cm hematoma within the subcutaneous soft tissues of the lateral right hip. Electronically Signed   By: Jasmine Pang M.D.   On: 02/26/2022 19:46   DG Hip Unilat W or Wo Pelvis 2-3 Views Right  Result Date: 02/26/2022 CLINICAL DATA:  Fall.  Evaluate for right hip  fracture. EXAM: DG HIP (WITH OR WITHOUT PELVIS) 2-3V RIGHT COMPARISON:  CT abdomen and pelvis 02/16/2022 FINDINGS: There is diffuse decreased bone mineralization. Moderate bilateral sacroiliac subchondral sclerosis. Mild bilateral superior femoroacetabular joint space  narrowing and peripheral acetabular degenerative osteophytes. There is new acute angulation and mild cortical step-off of the lateral right femoral head-neck junction indicating an acute proximal femoral neck fracture. Minimal medial displacement of the right femoral neck with respect of the right femoral head. Severe right and moderate left L4-5 disc space narrowing and peripheral endplate osteophytes. IMPRESSION: 1. Acute, mildly displaced right femoral neck fracture. 2. Severe right and moderate left L4-5 degenerative disc and facet changes. Electronically Signed   By: Neita Garnet M.D.   On: 02/26/2022 17:05   DG Wrist Complete Left  Result Date: 02/26/2022 CLINICAL DATA:  Fall EXAM: LEFT WRIST - COMPLETE 3+ VIEW COMPARISON:  None Available. FINDINGS: Advanced degenerative changes at the 1st carpometacarpal joint. Moderate arthritic changes in the radiocarpal joint. No acute bony abnormality. Specifically, no fracture, subluxation, or dislocation. IMPRESSION: No acute bony abnormality. Electronically Signed   By: Charlett Nose M.D.   On: 02/26/2022 17:05   DG Chest 1 View  Result Date: 02/26/2022 CLINICAL DATA:  Fall, right hip fracture EXAM: CHEST  1 VIEW COMPARISON:  02/16/2022 FINDINGS: Heart and mediastinal contours are within normal limits. No focal opacities or effusions. No acute bony abnormality. IMPRESSION: No active disease. Electronically Signed   By: Charlett Nose M.D.   On: 02/26/2022 17:04   CT HEAD WO CONTRAST ( )  Result Date: 02/26/2022 CLINICAL DATA:  Dementia, fall EXAM: CT HEAD WITHOUT CONTRAST CT CERVICAL SPINE WITHOUT CONTRAST TECHNIQUE: Multidetector CT imaging of the head and cervical spine was performed following the standard protocol without intravenous contrast. Multiplanar CT image reconstructions of the cervical spine were also generated. RADIATION DOSE REDUCTION: This exam was performed according to the departmental dose-optimization program which includes automated  exposure control, adjustment of the mA and/or kV according to patient size and/or use of iterative reconstruction technique. COMPARISON:  CT brain 02/16/2022 FINDINGS: CT HEAD FINDINGS Brain: No acute territorial infarction, hemorrhage or intracranial mass. Atrophy. Patchy white matter hypodensity consistent with chronic small vessel ischemic change. Stable ventricle size. Vascular: No hyperdense vessels. Vertebral and carotid vascular calcification Skull: Normal. Negative for fracture or focal lesion. Sinuses/Orbits: No acute finding. Other: None CT CERVICAL SPINE FINDINGS Alignment: No subluxation.  Facet alignment is within normal limits. Skull base and vertebrae: No acute fracture. No primary bone lesion or focal pathologic process. Soft tissues and spinal canal: No prevertebral fluid or swelling. No visible canal hematoma. Disc levels: Multilevel degenerative change. Moderate severe disc space narrowing C5-C6 and C6-C7 with moderate disc space narrowing at C7-T1. Hypertrophic facet degenerative changes left greater than right at multiple levels with foraminal narrowing. Upper chest: Negative. Other: None IMPRESSION: 1. No CT evidence for acute intracranial abnormality. Atrophy and chronic small vessel ischemic changes of the white matter 2. Multilevel degenerative changes of the cervical spine. No acute osseous abnormality Electronically Signed   By: Jasmine Pang M.D.   On: 02/26/2022 16:46   CT Cervical Spine Wo Contrast  Result Date: 02/26/2022 CLINICAL DATA:  Dementia, fall EXAM: CT HEAD WITHOUT CONTRAST CT CERVICAL SPINE WITHOUT CONTRAST TECHNIQUE: Multidetector CT imaging of the head and cervical spine was performed following the standard protocol without intravenous contrast. Multiplanar CT image reconstructions of the cervical spine were also generated. RADIATION DOSE REDUCTION: This exam was performed according to the  departmental dose-optimization program which includes automated exposure  control, adjustment of the mA and/or kV according to patient size and/or use of iterative reconstruction technique. COMPARISON:  CT brain 02/16/2022 FINDINGS: CT HEAD FINDINGS Brain: No acute territorial infarction, hemorrhage or intracranial mass. Atrophy. Patchy white matter hypodensity consistent with chronic small vessel ischemic change. Stable ventricle size. Vascular: No hyperdense vessels. Vertebral and carotid vascular calcification Skull: Normal. Negative for fracture or focal lesion. Sinuses/Orbits: No acute finding. Other: None CT CERVICAL SPINE FINDINGS Alignment: No subluxation.  Facet alignment is within normal limits. Skull base and vertebrae: No acute fracture. No primary bone lesion or focal pathologic process. Soft tissues and spinal canal: No prevertebral fluid or swelling. No visible canal hematoma. Disc levels: Multilevel degenerative change. Moderate severe disc space narrowing C5-C6 and C6-C7 with moderate disc space narrowing at C7-T1. Hypertrophic facet degenerative changes left greater than right at multiple levels with foraminal narrowing. Upper chest: Negative. Other: None IMPRESSION: 1. No CT evidence for acute intracranial abnormality. Atrophy and chronic small vessel ischemic changes of the white matter 2. Multilevel degenerative changes of the cervical spine. No acute osseous abnormality Electronically Signed   By: Donavan Foil M.D.   On: 02/26/2022 16:46      IMPRESSION AND PLAN:  Assessment and Plan: * Closed right hip fracture [facility]) The patient will be- She will be admitted to a medical-surgical bed. - Pain management will be provided. - Eliquis will be held off. - Orthopedic consult will be obtained. - Dr. Sabra Heck was notified about the patient. - She has no history of diabetes mellitus on insulin, CVA, CHF, coronary artery disease, or renal failure with a creatinine more than 2.  She is considered average risk for her age for perioperative cardiovascular events per  the revised cardiac risk index.  She has no current pulmonary issues. -She will be kept n.p.o. after midnight for potential simple percutaneous pinning that is intended by Dr. Sabra Heck. - Beta-blocker therapy that should provide perioperative cardiovascular risk reduction, will be resumed.  Uncontrolled type 2 diabetes mellitus with hyperglycemia, without long-term current use of insulin ([place]) - The patient will be placed on supplemental coverage with NovoLog. - We will hold off metformin.  History of DVT (deep vein thrombosis) - We will have to temporarily hold off Eliquis for now for expected operative intervention tomorrow. - We will defer resumption of Eliquis to Dr. Ammie Ferrier discretion, especially given recent onset of acute DVT.Marland Kitchen  Dementia without behavioral disturbance ([place]) - We will continue Aricept and Namenda.  Essential hypertension We will continue- We will continue her antihypertensives.    DVT prophylaxis: SCDs. Advanced Care Planning:  Code Status: full code.  Family Communication:  The plan of care was discussed in details with the patient (and family). I answered all questions. The patient agreed to proceed with the above mentioned plan. Further management will depend upon hospital course. Disposition Plan: Back to previous home environment Consults called: Ortho consult. All the records are reviewed and case discussed with ED provider.  Status is: Inpatient    At the time of the admission, it appears that the appropriate admission status for this patient is inpatient.  This is judged to be reasonable and necessary in order to provide the required intensity of service to ensure the patient's safety given the presenting symptoms, physical exam findings and initial radiographic and laboratory data in the context of comorbid conditions.  The patient requires inpatient status due to high intensity of service, high risk of  further deterioration and high frequency of  surveillance required.  I certify that at the time of admission, it is my clinical judgment that the patient will require inpatient hospital care extending more than 2 midnights.                            Dispo: The patient is from: Home              Anticipated d/c is to: Home              Patient currently is not medically stable to d/c.              Difficult to place patient: No  Hannah Beat M.D on 02/26/2022 at 11:27 PM  [facility]   From 7 PM-7 AM, contact night-coverage [email or web address]  CC: Primary care physician; Normajean Glasgow, FNP

## 2022-02-26 NOTE — Assessment & Plan Note (Addendum)
The patient will be- She will be admitted to a medical-surgical bed. - Pain management will be provided. - Eliquis will be held off. - Orthopedic consult will be obtained. - Dr. Sabra Heck was notified about the patient. - She has no history of diabetes mellitus on insulin, CVA, CHF, coronary artery disease, or renal failure with a creatinine more than 2.  She is considered average risk for her age for perioperative cardiovascular events per the revised cardiac risk index.  She has no current pulmonary issues. -She will be kept n.p.o. after midnight for potential simple percutaneous pinning that is intended by Dr. Sabra Heck. - Beta-blocker therapy that should provide perioperative cardiovascular risk reduction, will be resumed.

## 2022-02-27 ENCOUNTER — Other Ambulatory Visit: Payer: Self-pay

## 2022-02-27 ENCOUNTER — Encounter: Payer: Self-pay | Admitting: Family Medicine

## 2022-02-27 ENCOUNTER — Inpatient Hospital Stay: Payer: Medicare Other

## 2022-02-27 ENCOUNTER — Inpatient Hospital Stay: Payer: Medicare Other | Admitting: Anesthesiology

## 2022-02-27 ENCOUNTER — Encounter
Admission: EM | Disposition: A | Discharge status: Skilled Nursing Facility | Payer: Self-pay | Source: Home / Self Care | Attending: Internal Medicine

## 2022-02-27 DIAGNOSIS — S72001A Fracture of unspecified part of neck of right femur, initial encounter for closed fracture: Secondary | ICD-10-CM | POA: Diagnosis not present

## 2022-02-27 HISTORY — PX: HIP PINNING,CANNULATED: SHX1758

## 2022-02-27 LAB — HEMOGLOBIN A1C
Hgb A1c MFr Bld: 9.6 % — ABNORMAL HIGH (ref 4.8–5.6)
Mean Plasma Glucose: 228.82 mg/dL

## 2022-02-27 LAB — GLUCOSE, CAPILLARY
Glucose-Capillary: 167 mg/dL — ABNORMAL HIGH (ref 70–99)
Glucose-Capillary: 152 mg/dL — ABNORMAL HIGH (ref 70–99)
Glucose-Capillary: 227 mg/dL — ABNORMAL HIGH (ref 70–99)
Glucose-Capillary: 344 mg/dL — ABNORMAL HIGH (ref 70–99)

## 2022-02-27 LAB — TYPE AND SCREEN
ABO/RH(D): A POS
Antibody Screen: NEGATIVE

## 2022-02-27 LAB — MRSA NEXT GEN BY PCR, NASAL: MRSA by PCR Next Gen: NOT DETECTED

## 2022-02-27 SURGERY — FIXATION, FEMUR, NECK, PERCUTANEOUS, USING SCREW
Anesthesia: General | Site: Hip | Laterality: Right

## 2022-02-27 MED ORDER — HALOPERIDOL LACTATE 2 MG/ML PO CONC: 0.5000 mg | Freq: Four times a day (QID) | ORAL | Status: DC | PRN

## 2022-02-27 MED ORDER — ONDANSETRON HCL 4 MG/2ML IJ SOLN
INTRAMUSCULAR | Status: AC
  Filled 2022-02-27: qty 2

## 2022-02-27 MED ORDER — ZOLPIDEM TARTRATE 5 MG PO TABS: 5.0000 mg | ORAL_TABLET | Freq: Every evening | ORAL | Status: DC | PRN

## 2022-02-27 MED ORDER — FLEET ENEMA 7-19 GM/118ML RE ENEM: 1.0000 | ENEMA | Freq: Once | RECTAL | Status: DC | PRN

## 2022-02-27 MED ORDER — CEFAZOLIN SODIUM-DEXTROSE 2-4 GM/100ML-% IV SOLN
2.0000 g | Freq: Three times a day (TID) | INTRAVENOUS | Status: AC
  Administered 2022-02-27 – 2022-02-28 (×3): 2 g via INTRAVENOUS
  Filled 2022-02-27 (×3): qty 100

## 2022-02-27 MED ORDER — HYDROCODONE-ACETAMINOPHEN 5-325 MG PO TABS
1.0000 | ORAL_TABLET | ORAL | Status: DC | PRN
  Administered 2022-02-28 (×2): 1 via ORAL
  Administered 2022-03-03: 2 via ORAL
  Filled 2022-02-27: qty 1
  Filled 2022-02-27: qty 2
  Filled 2022-02-27: qty 1

## 2022-02-27 MED ORDER — METOCLOPRAMIDE HCL 5 MG PO TABS: 5.0000 mg | ORAL_TABLET | Freq: Three times a day (TID) | ORAL | Status: DC | PRN

## 2022-02-27 MED ORDER — SODIUM CHLORIDE 0.45 % IV SOLN: INTRAVENOUS | Status: DC

## 2022-02-27 MED ORDER — SEVOFLURANE IN SOLN
RESPIRATORY_TRACT | Status: AC
  Filled 2022-02-27: qty 250

## 2022-02-27 MED ORDER — OXYCODONE HCL 5 MG/5ML PO SOLN: 5.0000 mg | Freq: Once | ORAL | Status: DC | PRN

## 2022-02-27 MED ORDER — ADULT MULTIVITAMIN W/MINERALS CH
1.0000 | ORAL_TABLET | Freq: Every day | ORAL | Status: DC
  Administered 2022-02-28 – 2022-03-03 (×4): 1 via ORAL
  Filled 2022-02-27 (×4): qty 1

## 2022-02-27 MED ORDER — BUPIVACAINE-EPINEPHRINE (PF) 0.5% -1:200000 IJ SOLN
INTRAMUSCULAR | Status: DC | PRN
  Administered 2022-02-27: 30 mL via PERINEURAL

## 2022-02-27 MED ORDER — INSULIN ASPART 100 UNIT/ML IJ SOLN
9.0000 [IU] | Freq: Once | INTRAMUSCULAR | Status: AC
  Administered 2022-02-27: 9 [IU] via SUBCUTANEOUS
  Filled 2022-02-27: qty 1

## 2022-02-27 MED ORDER — SODIUM CHLORIDE 0.9 % IV SOLN: INTRAVENOUS | Status: DC

## 2022-02-27 MED ORDER — ONDANSETRON HCL 4 MG/2ML IJ SOLN
INTRAMUSCULAR | Status: DC | PRN
  Administered 2022-02-27: 4 mg via INTRAVENOUS

## 2022-02-27 MED ORDER — INSULIN ASPART 100 UNIT/ML IJ SOLN
0.0000 [IU] | Freq: Three times a day (TID) | INTRAMUSCULAR | Status: DC
  Administered 2022-02-27: 2 [IU] via SUBCUTANEOUS
  Administered 2022-02-27 – 2022-02-28 (×3): 3 [IU] via SUBCUTANEOUS
  Administered 2022-02-28: 5 [IU] via SUBCUTANEOUS
  Administered 2022-03-01: 2 [IU] via SUBCUTANEOUS
  Filled 2022-02-27 (×7): qty 1

## 2022-02-27 MED ORDER — RISPERIDONE 0.25 MG PO TABS
0.2500 mg | ORAL_TABLET | Freq: Every day | ORAL | Status: DC | PRN
  Administered 2022-02-28 – 2022-03-01 (×4): 0.25 mg via ORAL
  Filled 2022-02-27 (×5): qty 1

## 2022-02-27 MED ORDER — OXYCODONE HCL 5 MG PO TABS: 5.0000 mg | ORAL_TABLET | Freq: Once | ORAL | Status: DC | PRN

## 2022-02-27 MED ORDER — PROPOFOL 10 MG/ML IV BOLUS
INTRAVENOUS | Status: DC | PRN
  Administered 2022-02-27: 80 mg via INTRAVENOUS

## 2022-02-27 MED ORDER — ACETAMINOPHEN 325 MG PO TABS
325.0000 mg | ORAL_TABLET | Freq: Four times a day (QID) | ORAL | Status: DC | PRN
  Administered 2022-02-28: 325 mg via ORAL
  Filled 2022-02-27: qty 1

## 2022-02-27 MED ORDER — FERROUS SULFATE 325 (65 FE) MG PO TABS
325.0000 mg | ORAL_TABLET | Freq: Every day | ORAL | Status: DC
  Administered 2022-02-28 – 2022-03-03 (×4): 325 mg via ORAL
  Filled 2022-02-27 (×4): qty 1

## 2022-02-27 MED ORDER — ACETAMINOPHEN 10 MG/ML IV SOLN
INTRAVENOUS | Status: AC
  Filled 2022-02-27: qty 100

## 2022-02-27 MED ORDER — PROPOFOL 10 MG/ML IV BOLUS
INTRAVENOUS | Status: AC
  Filled 2022-02-27: qty 20

## 2022-02-27 MED ORDER — PHENYLEPHRINE 80 MCG/ML (10ML) SYRINGE FOR IV PUSH (FOR BLOOD PRESSURE SUPPORT)
PREFILLED_SYRINGE | INTRAVENOUS | Status: DC | PRN
  Administered 2022-02-27 (×3): 80 ug via INTRAVENOUS

## 2022-02-27 MED ORDER — LIDOCAINE HCL (CARDIAC) PF 100 MG/5ML IV SOSY
PREFILLED_SYRINGE | INTRAVENOUS | Status: DC | PRN
  Administered 2022-02-27: 60 mg via INTRAVENOUS

## 2022-02-27 MED ORDER — INSULIN ASPART 100 UNIT/ML IJ SOLN: 0.0000 [IU] | Freq: Three times a day (TID) | INTRAMUSCULAR | Status: DC

## 2022-02-27 MED ORDER — ALUM & MAG HYDROXIDE-SIMETH 200-200-20 MG/5ML PO SUSP: 30.0000 mL | ORAL | Status: DC | PRN

## 2022-02-27 MED ORDER — METOCLOPRAMIDE HCL 5 MG/ML IJ SOLN: 5.0000 mg | Freq: Three times a day (TID) | INTRAMUSCULAR | Status: DC | PRN

## 2022-02-27 MED ORDER — PHENOL 1.4 % MT LIQD: 1.0000 | OROMUCOSAL | Status: DC | PRN

## 2022-02-27 MED ORDER — DEXAMETHASONE SODIUM PHOSPHATE 10 MG/ML IJ SOLN
INTRAMUSCULAR | Status: DC | PRN
  Administered 2022-02-27: 5 mg via INTRAVENOUS

## 2022-02-27 MED ORDER — MORPHINE SULFATE (PF) 2 MG/ML IV SOLN
0.5000 mg | INTRAVENOUS | Status: DC | PRN
  Administered 2022-02-28: 1 mg via INTRAVENOUS
  Filled 2022-02-27: qty 1

## 2022-02-27 MED ORDER — BISACODYL 10 MG RE SUPP: 10.0000 mg | Freq: Every day | RECTAL | Status: DC | PRN

## 2022-02-27 MED ORDER — SENNA 8.6 MG PO TABS
1.0000 | ORAL_TABLET | Freq: Two times a day (BID) | ORAL | Status: DC
  Administered 2022-02-27 – 2022-03-03 (×7): 8.6 mg via ORAL
  Filled 2022-02-27 (×7): qty 1

## 2022-02-27 MED ORDER — ROCURONIUM BROMIDE 100 MG/10ML IV SOLN
INTRAVENOUS | Status: DC | PRN
  Administered 2022-02-27: 40 mg via INTRAVENOUS
  Administered 2022-02-27: 20 mg via INTRAVENOUS

## 2022-02-27 MED ORDER — FENTANYL CITRATE (PF) 100 MCG/2ML IJ SOLN
INTRAMUSCULAR | Status: AC
  Filled 2022-02-27: qty 2

## 2022-02-27 MED ORDER — SUGAMMADEX SODIUM 200 MG/2ML IV SOLN
INTRAVENOUS | Status: DC | PRN
  Administered 2022-02-27: 200 mg via INTRAVENOUS

## 2022-02-27 MED ORDER — LIDOCAINE HCL (PF) 2 % IJ SOLN
INTRAMUSCULAR | Status: AC
  Filled 2022-02-27: qty 5

## 2022-02-27 MED ORDER — DEXAMETHASONE SODIUM PHOSPHATE 10 MG/ML IJ SOLN
INTRAMUSCULAR | Status: AC
  Filled 2022-02-27: qty 1

## 2022-02-27 MED ORDER — 0.9 % SODIUM CHLORIDE (POUR BTL) OPTIME
TOPICAL | Status: DC | PRN
  Administered 2022-02-27: 500 mL

## 2022-02-27 MED ORDER — ENSURE ENLIVE PO LIQD
237.0000 mL | Freq: Two times a day (BID) | ORAL | Status: DC
  Administered 2022-02-28 – 2022-03-01 (×4): 237 mL via ORAL

## 2022-02-27 MED ORDER — MENTHOL 3 MG MT LOZG: 1.0000 | LOZENGE | OROMUCOSAL | Status: DC | PRN

## 2022-02-27 MED ORDER — FENTANYL CITRATE (PF) 100 MCG/2ML IJ SOLN: 25.0000 ug | INTRAMUSCULAR | Status: DC | PRN

## 2022-02-27 MED ORDER — FENTANYL CITRATE (PF) 100 MCG/2ML IJ SOLN
INTRAMUSCULAR | Status: DC | PRN
  Administered 2022-02-27 (×2): 50 ug via INTRAVENOUS

## 2022-02-27 MED ORDER — HALOPERIDOL LACTATE 2 MG/ML PO CONC
1.0000 mg | Freq: Four times a day (QID) | ORAL | Status: DC | PRN
  Filled 2022-02-27: qty 5

## 2022-02-27 MED ORDER — ROCURONIUM BROMIDE 10 MG/ML (PF) SYRINGE
PREFILLED_SYRINGE | INTRAVENOUS | Status: AC
  Filled 2022-02-27: qty 10

## 2022-02-27 MED ORDER — ACETAMINOPHEN 10 MG/ML IV SOLN: 1000.0000 mg | Freq: Once | INTRAVENOUS | Status: DC | PRN

## 2022-02-27 MED ORDER — ONDANSETRON HCL 4 MG/2ML IJ SOLN: 4.0000 mg | Freq: Once | INTRAMUSCULAR | Status: DC | PRN

## 2022-02-27 MED ORDER — HYDROCODONE-ACETAMINOPHEN 7.5-325 MG PO TABS
1.0000 | ORAL_TABLET | ORAL | Status: DC | PRN
  Administered 2022-03-02: 1 via ORAL
  Filled 2022-02-27: qty 1

## 2022-02-27 MED ORDER — ACETAMINOPHEN 10 MG/ML IV SOLN
INTRAVENOUS | Status: DC | PRN
  Administered 2022-02-27: 1000 mg via INTRAVENOUS

## 2022-02-27 SURGICAL SUPPLY — 30 items
BIT DRILL CANN LRG QC 5X300 (BIT) IMPLANT
BLADE SURG SZ11 CARB STEEL (BLADE) ×1 IMPLANT
BNDG COHESIVE 4X5 TAN STRL LF (GAUZE/BANDAGES/DRESSINGS) ×1 IMPLANT
CHLORAPREP W/TINT 26 (MISCELLANEOUS) ×1 IMPLANT
DRSG AQUACEL AG ADV 3.5X10 (GAUZE/BANDAGES/DRESSINGS) ×1 IMPLANT
DRSG XEROFORM 1X8 (GAUZE/BANDAGES/DRESSINGS) IMPLANT
GAUZE SPONGE 4X4 12PLY STRL (GAUZE/BANDAGES/DRESSINGS) ×1 IMPLANT
GLOVE BIOGEL PI IND STRL 8 (GLOVE) ×1 IMPLANT
GLOVE SURG XRAY 8.5 LX (GLOVE) ×1 IMPLANT
GOWN STRL REUS W/ TWL LRG LVL3 (GOWN DISPOSABLE) ×1 IMPLANT
GOWN STRL REUS W/TWL LRG LVL3 (GOWN DISPOSABLE) ×1
GOWN STRL REUS W/TWL LRG LVL4 (GOWN DISPOSABLE) ×1 IMPLANT
KIT TURNOVER KIT A (KITS) ×1 IMPLANT
MAT ABSORB  FLUID 56X50 GRAY (MISCELLANEOUS) ×1
MAT ABSORB FLUID 56X50 GRAY (MISCELLANEOUS) ×1 IMPLANT
NDL SPNL 18GX3.5 QUINCKE PK (NEEDLE) ×1 IMPLANT
NEEDLE SPNL 18GX3.5 QUINCKE PK (NEEDLE) ×1 IMPLANT
NS IRRIG 500ML POUR BTL (IV SOLUTION) ×1 IMPLANT
PACK HIP COMPR (MISCELLANEOUS) ×1 IMPLANT
PAD ABD DERMACEA PRESS 5X9 (GAUZE/BANDAGES/DRESSINGS) IMPLANT
SCREW 6.5MM CANN 32X85 (Screw) IMPLANT
SCREW CANN 16 THRD/90 6.5 (Screw) IMPLANT
SCREW CANN 6.5X90 (Screw) ×2 IMPLANT
SCREW CANN 6.5X90 32MM THD (Screw) IMPLANT
SOL PREP PVP 2OZ (MISCELLANEOUS) ×1
SOLUTION PREP PVP 2OZ (MISCELLANEOUS) ×1 IMPLANT
SUT ETHILON 3 0 FSLX (SUTURE) ×1 IMPLANT
SYR 30ML LL (SYRINGE) ×1 IMPLANT
WATER STERILE IRR 500ML POUR (IV SOLUTION) ×1 IMPLANT
WIRE G W/FLUTE 2.8X300 (MISCELLANEOUS) IMPLANT

## 2022-02-27 NOTE — Transfer of Care (Signed)
Immediate Anesthesia Transfer of Care Note  Patient: Sonya Jones  Procedure(s) Performed: PERCUTANEOUS FIXATION OF FEMORAL NECK (Right: Hip)  Patient Location: PACU  Anesthesia Type:General  Level of Consciousness: drowsy  Airway & Oxygen Therapy: Patient Spontanous Breathing and Patient connected to nasal cannula oxygen  Post-op Assessment: Report given to RN, Post -op Vital signs reviewed and stable and Patient moving all extremities  Post vital signs: Reviewed and stable  Last Vitals:  Vitals Value Taken Time  BP 115/52 02/27/22 1149  Temp    Pulse 73 02/27/22 1153  Resp 15 02/27/22 1153  SpO2 95 % 02/27/22 1153  Vitals shown include unvalidated device data.  Last Pain:  Vitals:   02/27/22 0320  TempSrc:   PainSc: Asleep         Complications: No notable events documented.

## 2022-02-27 NOTE — Anesthesia Procedure Notes (Signed)
Procedure Name: Intubation Date/Time: 02/27/2022 10:43 AM  Performed by: Esaw Grandchild, CRNAPre-anesthesia Checklist: Patient identified, Emergency Drugs available, Suction available and Patient being monitored Patient Re-evaluated:Patient Re-evaluated prior to induction Oxygen Delivery Method: Circle system utilized Preoxygenation: Pre-oxygenation with 100% oxygen Induction Type: IV induction Ventilation: Mask ventilation without difficulty and Oral airway inserted - appropriate to patient size Laryngoscope Size: Sabra Heck and 2 Grade View: Grade I Tube type: Oral Tube size: 6.5 mm Number of attempts: 1 Airway Equipment and Method: Stylet, Oral airway and Bite block Placement Confirmation: ETT inserted through vocal cords under direct vision, positive ETCO2 and breath sounds checked- equal and bilateral Secured at: 20 cm Tube secured with: Tape Dental Injury: Teeth and Oropharynx as per pre-operative assessment

## 2022-02-27 NOTE — Consult Note (Signed)
ORTHOPAEDIC CONSULTATION  REQUESTING PHYSICIAN: Elmarie Shiley, MD  Chief Complaint: Right hip pain  HPI: Sonya Jones is a 88 y.o. female who complains of right hip pain after a fall yesterday at her nursing home.  Patient has a history of vascular dementia and is a full-time nursing home resident.  She was brought to the emergency room where exam and x-rays including a CT scan show a mildly impacted subcapital fracture of the right hip.  She has been on Eliquis.  It is felt that percutaneous pinning of this fracture is indicated to prevent further displacement of the fracture fragments.  We have attempted to contact the patient's daughter without success this morning.  We will attempt to keep contacting her.  Past Medical History:  Diagnosis Date   Diabetes mellitus without complication ([place])    History reviewed. No pertinent surgical history. Social History   Socioeconomic History   Marital status: Widowed    Spouse name: Not on file   Number of children: Not on file   Years of education: Not on file   Highest education level: Not on file  Occupational History   Not on file  Tobacco Use   Smoking status: Never   Smokeless tobacco: Never  Substance and Sexual Activity   Alcohol use: Not Currently   Drug use: Not Currently   Sexual activity: Not on file  Other Topics Concern   Not on file  Social History Narrative   Not on file   Social Determinants of Health   Financial Resource Strain: Not on file  Food Insecurity: Not on file  Transportation Needs: Not on file  Physical Activity: Not on file  Stress: Not on file  Social Connections: Not on file   History reviewed. No pertinent family history. No Known Allergies Prior to Admission medications   Medication Sig Start Date End Date Taking? Authorizing Provider  acetaminophen (TYLENOL) 325 MG tablet Take 650 mg by mouth every 6 (six) hours as needed.   Yes [provider]  apixaban (ELIQUIS) 5 MG TABS  tablet Take 1 tablet by mouth in the morning and at bedtime. 02/26/22 03/28/22 Yes [provider]  donepezil (ARICEPT) 5 MG tablet Take 1 tablet by mouth at bedtime.   Yes [provider]  memantine (NAMENDA) 5 MG tablet Take 1 tablet by mouth in the morning and at bedtime.   Yes [provider]  metoprolol tartrate (LOPRESSOR) 25 MG tablet Take 1 tablet by mouth in the morning and at bedtime.   Yes [provider]  Cholecalciferol (VITAMIN D3) 50 MCG (2000 UT) TABS Take 1 tablet by mouth daily. Patient not taking: Reported on 02/26/2022 02/07/20   [provider]  lisinopril (ZESTRIL) 5 MG tablet Take 5 mg by mouth daily. Patient not taking: Reported on 02/26/2022 03/21/20   [provider]  metFORMIN (GLUCOPHAGE) 500 MG tablet Take 500 mg by mouth 2 (two) times daily. Patient not taking: Reported on 02/26/2022 03/10/20   [provider]   CT Hip Right Wo Contrast  Result Date: 02/26/2022 CLINICAL DATA:  Mechanical fall EXAM: CT OF THE RIGHT HIP WITHOUT CONTRAST TECHNIQUE: Multidetector CT imaging of the right hip was performed according to the standard protocol. Multiplanar CT image reconstructions were also generated. RADIATION DOSE REDUCTION: This exam was performed according to the departmental dose-optimization program which includes automated exposure control, adjustment of the mA and/or kV according to patient size and/or use of iterative reconstruction technique. COMPARISON:  Radiograph 02/26/2022  FINDINGS: Bones/Joint/Cartilage Acute mildly impacted subcapital right femoral neck fracture. No femoral head dislocation. Mild degenerative change of the right hip with joint space narrowing and spurring. No sizable hip effusion. Ligaments Suboptimally assessed by CT. Muscles and Tendons No intramuscular collections.  No significant atrophy. Soft tissues Edema within the right hip soft tissues. Slightly dense masses within the  subcutaneous soft tissues of the lateral right hip measuring 5.4 by 4 cm consistent with hematoma. IMPRESSION: 1. Acute mildly impacted subcapital right femoral neck fracture with normal femoral head alignment 2. 5.4 cm hematoma within the subcutaneous soft tissues of the lateral right hip. Electronically Signed   By: Donavan Foil M.D.   On: 02/26/2022 19:46   DG Hip Unilat W or Wo Pelvis 2-3 Views Right  Result Date: 02/26/2022 CLINICAL DATA:  Fall.  Evaluate for right hip fracture. EXAM: DG HIP (WITH OR WITHOUT PELVIS) 2-3V RIGHT COMPARISON:  CT abdomen and pelvis 02/16/2022 FINDINGS: There is diffuse decreased bone mineralization. Moderate bilateral sacroiliac subchondral sclerosis. Mild bilateral superior femoroacetabular joint space narrowing and peripheral acetabular degenerative osteophytes. There is new acute angulation and mild cortical step-off of the lateral right femoral head-neck junction indicating an acute proximal femoral neck fracture. Minimal medial displacement of the right femoral neck with respect of the right femoral head. Severe right and moderate left L4-5 disc space narrowing and peripheral endplate osteophytes. IMPRESSION: 1. Acute, mildly displaced right femoral neck fracture. 2. Severe right and moderate left L4-5 degenerative disc and facet changes. Electronically Signed   By: Yvonne Kendall M.D.   On: 02/26/2022 17:05   DG Wrist Complete Left  Result Date: 02/26/2022 CLINICAL DATA:  Fall EXAM: LEFT WRIST - COMPLETE 3+ VIEW COMPARISON:  None Available. FINDINGS: Advanced degenerative changes at the 1st carpometacarpal joint. Moderate arthritic changes in the radiocarpal joint. No acute bony abnormality. Specifically, no fracture, subluxation, or dislocation. IMPRESSION: No acute bony abnormality. Electronically Signed   By: Rolm Baptise M.D.   On: 02/26/2022 17:05   DG Chest 1 View  Result Date: 02/26/2022 CLINICAL DATA:  Fall, right hip fracture EXAM: CHEST  1 VIEW  COMPARISON:  02/16/2022 FINDINGS: Heart and mediastinal contours are within normal limits. No focal opacities or effusions. No acute bony abnormality. IMPRESSION: No active disease. Electronically Signed   By: Rolm Baptise M.D.   On: 02/26/2022 17:04   CT HEAD WO CONTRAST (5MM)  Result Date: 02/26/2022 CLINICAL DATA:  Dementia, fall EXAM: CT HEAD WITHOUT CONTRAST CT CERVICAL SPINE WITHOUT CONTRAST TECHNIQUE: Multidetector CT imaging of the head and cervical spine was performed following the standard protocol without intravenous contrast. Multiplanar CT image reconstructions of the cervical spine were also generated. RADIATION DOSE REDUCTION: This exam was performed according to the departmental dose-optimization program which includes automated exposure control, adjustment of the mA and/or kV according to patient size and/or use of iterative reconstruction technique. COMPARISON:  CT brain 02/16/2022 FINDINGS: CT HEAD FINDINGS Brain: No acute territorial infarction, hemorrhage or intracranial mass. Atrophy. Patchy white matter hypodensity consistent with chronic small vessel ischemic change. Stable ventricle size. Vascular: No hyperdense vessels. Vertebral and carotid vascular calcification Skull: Normal. Negative for fracture or focal lesion. Sinuses/Orbits: No acute finding. Other: None CT CERVICAL SPINE FINDINGS Alignment: No subluxation.  Facet alignment is within normal limits. Skull base and vertebrae: No acute fracture. No primary bone lesion or focal pathologic process. Soft tissues and spinal canal: No prevertebral fluid or swelling. No visible canal hematoma. Disc levels: Multilevel degenerative change. Moderate severe  disc space narrowing C5-C6 and C6-C7 with moderate disc space narrowing at C7-T1. Hypertrophic facet degenerative changes left greater than right at multiple levels with foraminal narrowing. Upper chest: Negative. Other: None IMPRESSION: 1. No CT evidence for acute intracranial  abnormality. Atrophy and chronic small vessel ischemic changes of the white matter 2. Multilevel degenerative changes of the cervical spine. No acute osseous abnormality Electronically Signed   By: Donavan Foil M.D.   On: 02/26/2022 16:46   CT Cervical Spine Wo Contrast  Result Date: 02/26/2022 CLINICAL DATA:  Dementia, fall EXAM: CT HEAD WITHOUT CONTRAST CT CERVICAL SPINE WITHOUT CONTRAST TECHNIQUE: Multidetector CT imaging of the head and cervical spine was performed following the standard protocol without intravenous contrast. Multiplanar CT image reconstructions of the cervical spine were also generated. RADIATION DOSE REDUCTION: This exam was performed according to the departmental dose-optimization program which includes automated exposure control, adjustment of the mA and/or kV according to patient size and/or use of iterative reconstruction technique. COMPARISON:  CT brain 02/16/2022 FINDINGS: CT HEAD FINDINGS Brain: No acute territorial infarction, hemorrhage or intracranial mass. Atrophy. Patchy white matter hypodensity consistent with chronic small vessel ischemic change. Stable ventricle size. Vascular: No hyperdense vessels. Vertebral and carotid vascular calcification Skull: Normal. Negative for fracture or focal lesion. Sinuses/Orbits: No acute finding. Other: None CT CERVICAL SPINE FINDINGS Alignment: No subluxation.  Facet alignment is within normal limits. Skull base and vertebrae: No acute fracture. No primary bone lesion or focal pathologic process. Soft tissues and spinal canal: No prevertebral fluid or swelling. No visible canal hematoma. Disc levels: Multilevel degenerative change. Moderate severe disc space narrowing C5-C6 and C6-C7 with moderate disc space narrowing at C7-T1. Hypertrophic facet degenerative changes left greater than right at multiple levels with foraminal narrowing. Upper chest: Negative. Other: None IMPRESSION: 1. No CT evidence for acute intracranial abnormality.  Atrophy and chronic small vessel ischemic changes of the white matter 2. Multilevel degenerative changes of the cervical spine. No acute osseous abnormality Electronically Signed   By: Donavan Foil M.D.   On: 02/26/2022 16:46    Positive ROS: All other systems have been reviewed and were otherwise negative with the exception of those mentioned in the HPI and as above.  Physical Exam: General: Alert, no acute distress Cardiovascular: No pedal edema Respiratory: No cyanosis, no use of accessory musculature GI: No organomegaly, abdomen is soft and non-tender Skin: No lesions in the area of chief complaint Neurologic: Sensation intact distally Psychiatric: Patient is competent for consent with normal mood and affect Lymphatic: No axillary or cervical lymphadenopathy  MUSCULOSKELETAL: Right hip has pain with range of motion.  There is no shortening.  Neurovascular status is good.  There is bruising over the hip itself.  Assessment: Impacted right subcapital hip fracture  Plan: Right hip pinning after getting approval from the daughter.    Park Breed, MD [phone]   02/27/2022 9:59 AM

## 2022-02-27 NOTE — Op Note (Signed)
02/27/2022  11:44 AM  PATIENT:  Sonya Jones    PRE-OPERATIVE DIAGNOSIS:  hip fracture right impacted subcapital  POST-OPERATIVE DIAGNOSIS:  Same  PROCEDURE:  PERCUTANEOUS FIXATION OF RIGHT FEMORAL NECK with four 6.5 millimeters screws  SURGEON:  Park Breed, MD     ANESTHESIA:   General endotracheal  PREOPERATIVE INDICATIONS:  [name] is a  88 y.o. female who fell and was found to have a diagnosis of hip fracture who elected for surgical management.    The risks benefits and alternatives were discussed with the patient preoperatively including but not limited to the risks of infection, bleeding, nerve injury, cardiopulmonary complications, blood clots, malunion, nonunion, avascular necrosis, the need for revision surgery, the potential for conversion to hemiarthroplasty, among others, and the patient was willing to proceed.  OPERATIVE IMPLANTS: 6.5 mm cannulated screws x4  OPERATIVE FINDINGS: Clinical osteoporosis with weak bone, proximal femur  OPERATIVE PROCEDURE: The patient was brought to the operating room and placed in supine position. IV antibiotics were given. General anesthesia administered.  The patient was placed on the fracture table. The operative extremity was positioned, without any significant reduction maneuver and was prepped and draped in usual sterile fashion.  Time out was performed. Half percent Marcaine with epinephrine was injected into the operative region. Small incisions were made distal to the greater trochanter, and 4 guidewires were introduced into the head and neck. The lengths were measured. The reduction was slightly valgus, and near-anatomic. I opened the cortex with a cannulated drill, and then placed the screws into position. Satisfactory fixation was achieved.  The wounds were irrigated copiously, and repaired with  3-O Nylon with and sterile gauze. Sponge and needle count were correct.  There no complications and the patient tolerated the  procedure well.  The patient will be touch down weightbearing as tolerated, and will be on Lovenox  for DVT prophylaxis.   Park Breed, MD

## 2022-02-27 NOTE — Progress Notes (Signed)
Initial Nutrition Assessment  INTERVENTION:   -Needs weight for admission  -Ensure Plus High Protein po BID, each supplement provides 350 kcal and 20 grams of protein.   -Multivitamin with minerals daily  NUTRITION DIAGNOSIS:   Increased nutrient needs related to hip fracture, post-op healing as evidenced by estimated needs.  GOAL:   Patient will meet greater than or equal to 90% of their needs  MONITOR:   PO intake, Supplement acceptance, Weight trends, I & O's, Labs  REASON FOR ASSESSMENT:   Consult Hip fracture protocol  ASSESSMENT:   88 y.o. female with medical history significant for type 2 diabetes mellitus, vascular dementia, DVT on Eliquis who presented to the emergency room from her SNF with acute onset of accidental mechanical fall while she was bending over to pick up a book with subsequent fall on her right side and right hip pain.  X-ray was obtained and SNF revealing a fracture.  Patient with dementia, currently disoriented. NPO awaiting hip repair surgery this morning.  Pt will likely benefit from nutritional supplements following surgery, will place order for Ensure.  Per weight records, no weight history available. Pt needs weight for this admission.  Medications reviewed.  Labs reviewed.  NUTRITION - FOCUSED PHYSICAL EXAM:  Unable to complete, working remotely.  Diet Order:   Diet Order             Diet NPO time specified  Diet effective midnight                   EDUCATION NEEDS:   Not appropriate for education at this time  Skin:  Skin Assessment: Reviewed RN Assessment  Last BM:  PTA  Height:   Ht Readings from Last 1 Encounters:  No data found for Ht    Weight:   Wt Readings from Last 1 Encounters:  No data found for Wt   BMI:  There is no height or weight on file to calculate BMI.  Estimated Nutritional Needs:   Kcal:  1500-1700  Protein:  70-80g  Fluid:  1.7L/day   Clayton Bibles, MS, RD, LDN Inpatient  Clinical Dietitian Contact information available via Amion

## 2022-02-27 NOTE — H&P (Signed)
THE PATIENT WAS SEEN PRIOR TO SURGERY TODAY.  HISTORY, ALLERGIES, HOME MEDICATIONS AND OPERATIVE PROCEDURE WERE REVIEWED. RISKS AND BENEFITS OF SURGERY DISCUSSED WITH PATIENT AGAIN.  NO CHANGES FROM INITIAL HISTORY AND PHYSICAL NOTED.      I have spoken to the patient's daughter by telephone.  I have explained the fracture and the proposed treatment to her.  The risks and benefits and postop protocol were discussed.  At her request we will proceed with surgical pinning of the fracture this morning.  I will attempt to call her afterwards.  Its been difficult to contact her as most calls with voicemail.

## 2022-02-27 NOTE — Anesthesia Postprocedure Evaluation (Signed)
Anesthesia Post Note  Patient: Sonya Jones  Procedure(s) Performed: PERCUTANEOUS FIXATION OF FEMORAL NECK (Right: Hip)  Patient location during evaluation: PACU Anesthesia Type: General Level of consciousness: awake and alert Pain management: pain level controlled Vital Signs Assessment: post-procedure vital signs reviewed and stable Respiratory status: spontaneous breathing, nonlabored ventilation, respiratory function stable and patient connected to nasal cannula oxygen Cardiovascular status: blood pressure returned to baseline and stable Postop Assessment: no apparent nausea or vomiting Anesthetic complications: no   No notable events documented.   Last Vitals:  Vitals:   02/27/22 1215 02/27/22 1258  BP: (!) 134/56 (!) 122/55  Pulse: 74 72  Resp: 12 14  Temp: 36.6 C   SpO2: 96% 98%    Last Pain:  Vitals:   02/27/22 1215  TempSrc:   PainSc: Asleep                 Arita Miss

## 2022-02-27 NOTE — Progress Notes (Signed)
PROGRESS NOTE    Sonya ConchMary Jones  ZOX:[id number] DOB: 06-Nov-1932 DOA: 02/26/2022 PCP: Sonya Jones   Brief Narrative: 88 year old with past medical history significant for diabetes type 2, vascular dementia, DVT on Eliquis presented to the ED from SNF with acute onset of accidental mechanical fall while she was bending over to pick up a book, subsequently fell on her right side and hit her right hip.  CT head negative for acute intracranial abnormality.  Chest x-ray no acute cardiopulmonary disease.  Right hip CT revealed acute mildly impacted subcapital right femoral neck fracture with normal femoral head alignment.  5.4 cm hematoma within the subcutaneous soft tissue of the lateral right hip.  Orthopedic was consulted and patient underwent percutaneous fixation of the right femoral neck with four 6.5 millimeter screw on 02/27/2022 by Dr. Hyacinth Jones.   Assessment & Plan:   Principal Problem:   Closed right hip fracture (HCC) Active Problems:   Uncontrolled type 2 diabetes mellitus with hyperglycemia, without long-term current use of insulin (HCC)   Essential hypertension   Dementia without behavioral disturbance (HCC)   History of DVT (deep vein thrombosis)  1-Closed Right Hip fracture: Presented after mechanical fall CT hip: acute mildly impacted subcapital right femoral neck fracture with normal femoral head alignment.  5.4 cm hematoma within the subcutaneous soft tissue of the lateral right hip. Eliquis has been on hold. Dr. Hyacinth Jones consulted and patient underwent percutaneous fixation of the right femoral neck with four 6.5 millimeter screw on 02/27/2022   2-Diabetes type 2 uncontrolled with hyperglycemia Plan to start sliding scale insulin Hold metformin  History of DVT: Hold Eliquis in anticipation of surgery  hip hematoma.  Defer to Ortho resumption of Eliquis postsurgery.  Dementia Continue with Aricept and Namenda Avoid Haldol  Hypertension; continue with  metoprolol  Nutrition Problem: Increased nutrient needs Etiology: hip fracture, post-op healing    Signs/Symptoms: estimated needs    Interventions: Ensure Enlive (each supplement provides 350kcal and 20 grams of protein), MVI  There is no height or weight on file to calculate BMI.   DVT prophylaxis: SCD Code Status: Full code Family Communication: no family at bedside.  Disposition Plan:  Status is: Inpatient Remains inpatient appropriate because: management hip fracture    Consultants:  Ortho  Procedures:    Antimicrobials:    Subjective: She is alert, denies significant pain.   Objective: Vitals:   02/27/22 1149 02/27/22 1200 02/27/22 1215 02/27/22 1258  BP: (!) 115/52 (!) 129/58 (!) 134/56 (!) 122/55  Pulse: 73 75 74 72  Resp: 14 11 12 14   Temp: (!) 97.4 F (36.3 C)  97.8 F (36.6 C)   TempSrc:      SpO2: 95% 95% 96% 98%    Intake/Output Summary (Last 24 hours) at 02/27/2022 1408 Last data filed at 02/27/2022 1130 Gross per 24 hour  Intake 886.77 ml  Output 20 ml  Net 866.77 ml   There were no vitals filed for this visit.  Examination:  General exam: Appears calm and comfortable  Respiratory system: Clear to auscultation. Respiratory effort normal. Cardiovascular system: S1 & S2 heard, RRR.  Gastrointestinal system: Abdomen is nondistended, soft and nontender. No organomegaly or masses felt. Normal bowel sounds heard. Central nervous system: Alert a Extremities: no edema  Data Reviewed: I have personally reviewed following labs and imaging studies  CBC: Recent Labs  Lab 02/26/22 1740  WBC 9.4  NEUTROABS 7.9*  HGB 12.9  HCT 39.4  MCV 90.8  PLT 221  Basic Metabolic Panel: Recent Labs  Lab 02/26/22 1820  NA 137  K 3.6  CL 106  CO2 20*  GLUCOSE 343*  BUN 32*  CREATININE 1.26*  CALCIUM 9.2   GFR: CrCl cannot be calculated (Unknown ideal weight.). Liver Function Tests: No results for input(s): "AST", "ALT", "ALKPHOS",  "BILITOT", "PROT", "ALBUMIN" in the last 168 hours. No results for input(s): "LIPASE", "AMYLASE" in the last 168 hours. No results for input(s): "AMMONIA" in the last 168 hours. Coagulation Profile: Recent Labs  Lab 02/26/22 1820  INR 1.9*   Cardiac Enzymes: No results for input(s): "CKTOTAL", "CKMB", "CKMBINDEX", "TROPONINI" in the last 168 hours. BNP (last 3 results) No results for input(s): "PROBNP" in the last 8760 hours. HbA1C: No results for input(s): "HGBA1C" in the last 72 hours. CBG: Recent Labs  Lab 02/27/22 0857 02/27/22 1150  GLUCAP 167* 152*   Lipid Profile: No results for input(s): "CHOL", "HDL", "LDLCALC", "TRIG", "CHOLHDL", "LDLDIRECT" in the last 72 hours. Thyroid Function Tests: No results for input(s): "TSH", "T4TOTAL", "FREET4", "T3FREE", "THYROIDAB" in the last 72 hours. Anemia Panel: No results for input(s): "VITAMINB12", "FOLATE", "FERRITIN", "TIBC", "IRON", "RETICCTPCT" in the last 72 hours. Sepsis Labs: No results for input(s): "PROCALCITON", "LATICACIDVEN" in the last 168 hours.  Recent Results (from the past 240 hour(s))  Resp Panel by RT-PCR (Flu A&B, Covid) Anterior Nasal Swab     Status: None   Collection Time: 02/26/22  4:11 PM   Specimen: Anterior Nasal Swab  Result Value Ref Range Status   SARS Coronavirus 2 by RT PCR NEGATIVE NEGATIVE Final    Comment: (NOTE) SARS-CoV-2 target nucleic acids are NOT DETECTED.  The SARS-CoV-2 RNA is generally detectable in upper respiratory specimens during the acute phase of infection. The lowest concentration of SARS-CoV-2 viral copies this assay can detect is 138 copies/mL. A negative result does not preclude SARS-Cov-2 infection and should not be used as the sole basis for treatment or other patient management decisions. A negative result may occur with  improper specimen collection/handling, submission of specimen other than nasopharyngeal swab, presence of viral mutation(s) within the areas  targeted by this assay, and inadequate number of viral copies(<138 copies/mL). A negative result must be combined with clinical observations, patient history, and epidemiological information. The expected result is Negative.  Fact Sheet for Patients:  EntrepreneurPulse.com.au  Fact Sheet for Healthcare Providers:  IncredibleEmployment.be  This test is no t yet approved or cleared by the [place] FDA and  has been authorized for detection and/or diagnosis of SARS-CoV-2 by FDA under an Emergency Use Authorization (EUA). This EUA will remain  in effect (meaning this test can be used) for the duration of the COVID-19 declaration under Section 564(b)(1) of the Act, 21 U.S.C.section 360bbb-3(b)(1), unless the authorization is terminated  or revoked sooner.       Influenza A by PCR NEGATIVE NEGATIVE Final   Influenza B by PCR NEGATIVE NEGATIVE Final    Comment: (NOTE) The Xpert Xpress SARS-CoV-2/FLU/RSV plus assay is intended as an aid in the diagnosis of influenza from Nasopharyngeal swab specimens and should not be used as a sole basis for treatment. Nasal washings and aspirates are unacceptable for Xpert Xpress SARS-CoV-2/FLU/RSV testing.  Fact Sheet for Patients: EntrepreneurPulse.com.au  Fact Sheet for Healthcare Providers: IncredibleEmployment.be  This test is not yet approved or cleared by the [place] FDA and has been authorized for detection and/or diagnosis of SARS-CoV-2 by FDA under an Emergency Use Authorization (EUA). This EUA will remain in effect (  meaning this test can be used) for the duration of the COVID-19 declaration under Section 564(b)(1) of the Act, 21 U.S.C. section 360bbb-3(b)(1), unless the authorization is terminated or revoked.  Performed at [facility], [address] [place], [place] [address]   MRSA Next Gen by PCR, Nasal     Status: None   Collection  Time: 02/27/22  6:42 AM   Specimen: Nasal Mucosa; Nasal Swab  Result Value Ref Range Status   MRSA by PCR Next Gen NOT DETECTED NOT DETECTED Final    Comment: (NOTE) The GeneXpert MRSA Assay (FDA approved for NASAL specimens only), is one component of a comprehensive MRSA colonization surveillance program. It is not intended to diagnose MRSA infection nor to guide or monitor treatment for MRSA infections. Test performance is not FDA approved in patients less than 65 years old. Performed at [facility], [address] [place], [place] [address]          Radiology Studies: DG HIP UNILAT WITH PELVIS 2-3 VIEWS RIGHT  Result Date: 02/27/2022 CLINICAL DATA:  Surgical internal fixation of right hip fracture. EXAM: DG HIP (WITH OR WITHOUT PELVIS) 2-3V RIGHT; DG C-ARM 1-60 MIN-NO REPORT Fluoroscopy time: 59 seconds. COMPARISON:  February 26, 2022. FINDINGS: Two intraoperative fluoroscopic images were obtained of the right hip. These images demonstrate surgical internal fixation of proximal right femoral fracture. IMPRESSION: Fluoroscopic guidance provided during surgical internal fixation of proximal right femoral fracture. Electronically Signed   By: Lupita Raider M.D.   On: 02/27/2022 12:14   DG C-Arm 1-60 Min-No Report  Result Date: 02/27/2022 Fluoroscopy was utilized by the requesting physician.  No radiographic interpretation.   CT Hip Right Wo Contrast  Result Date: 02/26/2022 CLINICAL DATA:  Mechanical fall EXAM: CT OF THE RIGHT HIP WITHOUT CONTRAST TECHNIQUE: Multidetector CT imaging of the right hip was performed according to the standard protocol. Multiplanar CT image reconstructions were also generated. RADIATION DOSE REDUCTION: This exam was performed according to the departmental dose-optimization program which includes automated exposure control, adjustment of the mA and/or kV according to patient size and/or use of iterative reconstruction technique. COMPARISON:   Radiograph 02/26/2022 FINDINGS: Bones/Joint/Cartilage Acute mildly impacted subcapital right femoral neck fracture. No femoral head dislocation. Mild degenerative change of the right hip with joint space narrowing and spurring. No sizable hip effusion. Ligaments Suboptimally assessed by CT. Muscles and Tendons No intramuscular collections.  No significant atrophy. Soft tissues Edema within the right hip soft tissues. Slightly dense masses within the subcutaneous soft tissues of the lateral right hip measuring 5.4 by 4 cm consistent with hematoma. IMPRESSION: 1. Acute mildly impacted subcapital right femoral neck fracture with normal femoral head alignment 2. 5.4 cm hematoma within the subcutaneous soft tissues of the lateral right hip. Electronically Signed   By: Jasmine Pang M.D.   On: 02/26/2022 19:46   DG Hip Unilat W or Wo Pelvis 2-3 Views Right  Result Date: 02/26/2022 CLINICAL DATA:  Fall.  Evaluate for right hip fracture. EXAM: DG HIP (WITH OR WITHOUT PELVIS) 2-3V RIGHT COMPARISON:  CT abdomen and pelvis 02/16/2022 FINDINGS: There is diffuse decreased bone mineralization. Moderate bilateral sacroiliac subchondral sclerosis. Mild bilateral superior femoroacetabular joint space narrowing and peripheral acetabular degenerative osteophytes. There is new acute angulation and mild cortical step-off of the lateral right femoral head-neck junction indicating an acute proximal femoral neck fracture. Minimal medial displacement of the right femoral neck with respect of the right femoral head. Severe right and moderate left L4-5  disc space narrowing and peripheral endplate osteophytes. IMPRESSION: 1. Acute, mildly displaced right femoral neck fracture. 2. Severe right and moderate left L4-5 degenerative disc and facet changes. Electronically Signed   By: Neita Garnet M.D.   On: 02/26/2022 17:05   DG Wrist Complete Left  Result Date: 02/26/2022 CLINICAL DATA:  Fall EXAM: LEFT WRIST - COMPLETE 3+ VIEW  COMPARISON:  None Available. FINDINGS: Advanced degenerative changes at the 1st carpometacarpal joint. Moderate arthritic changes in the radiocarpal joint. No acute bony abnormality. Specifically, no fracture, subluxation, or dislocation. IMPRESSION: No acute bony abnormality. Electronically Signed   By: Charlett Nose M.D.   On: 02/26/2022 17:05   DG Chest 1 View  Result Date: 02/26/2022 CLINICAL DATA:  Fall, right hip fracture EXAM: CHEST  1 VIEW COMPARISON:  02/16/2022 FINDINGS: Heart and mediastinal contours are within normal limits. No focal opacities or effusions. No acute bony abnormality. IMPRESSION: No active disease. Electronically Signed   By: Charlett Nose M.D.   On: 02/26/2022 17:04   CT HEAD WO CONTRAST ( )  Result Date: 02/26/2022 CLINICAL DATA:  Dementia, fall EXAM: CT HEAD WITHOUT CONTRAST CT CERVICAL SPINE WITHOUT CONTRAST TECHNIQUE: Multidetector CT imaging of the head and cervical spine was performed following the standard protocol without intravenous contrast. Multiplanar CT image reconstructions of the cervical spine were also generated. RADIATION DOSE REDUCTION: This exam was performed according to the departmental dose-optimization program which includes automated exposure control, adjustment of the mA and/or kV according to patient size and/or use of iterative reconstruction technique. COMPARISON:  CT brain 02/16/2022 FINDINGS: CT HEAD FINDINGS Brain: No acute territorial infarction, hemorrhage or intracranial mass. Atrophy. Patchy white matter hypodensity consistent with chronic small vessel ischemic change. Stable ventricle size. Vascular: No hyperdense vessels. Vertebral and carotid vascular calcification Skull: Normal. Negative for fracture or focal lesion. Sinuses/Orbits: No acute finding. Other: None CT CERVICAL SPINE FINDINGS Alignment: No subluxation.  Facet alignment is within normal limits. Skull base and vertebrae: No acute fracture. No primary bone lesion or focal  pathologic process. Soft tissues and spinal canal: No prevertebral fluid or swelling. No visible canal hematoma. Disc levels: Multilevel degenerative change. Moderate severe disc space narrowing C5-C6 and C6-C7 with moderate disc space narrowing at C7-T1. Hypertrophic facet degenerative changes left greater than right at multiple levels with foraminal narrowing. Upper chest: Negative. Other: None IMPRESSION: 1. No CT evidence for acute intracranial abnormality. Atrophy and chronic small vessel ischemic changes of the white matter 2. Multilevel degenerative changes of the cervical spine. No acute osseous abnormality Electronically Signed   By: Jasmine Pang M.D.   On: 02/26/2022 16:46   CT Cervical Spine Wo Contrast  Result Date: 02/26/2022 CLINICAL DATA:  Dementia, fall EXAM: CT HEAD WITHOUT CONTRAST CT CERVICAL SPINE WITHOUT CONTRAST TECHNIQUE: Multidetector CT imaging of the head and cervical spine was performed following the standard protocol without intravenous contrast. Multiplanar CT image reconstructions of the cervical spine were also generated. RADIATION DOSE REDUCTION: This exam was performed according to the departmental dose-optimization program which includes automated exposure control, adjustment of the mA and/or kV according to patient size and/or use of iterative reconstruction technique. COMPARISON:  CT brain 02/16/2022 FINDINGS: CT HEAD FINDINGS Brain: No acute territorial infarction, hemorrhage or intracranial mass. Atrophy. Patchy white matter hypodensity consistent with chronic small vessel ischemic change. Stable ventricle size. Vascular: No hyperdense vessels. Vertebral and carotid vascular calcification Skull: Normal. Negative for fracture or focal lesion. Sinuses/Orbits: No acute finding. Other: None CT CERVICAL SPINE FINDINGS Alignment:  No subluxation.  Facet alignment is within normal limits. Skull base and vertebrae: No acute fracture. No primary bone lesion or focal pathologic  process. Soft tissues and spinal canal: No prevertebral fluid or swelling. No visible canal hematoma. Disc levels: Multilevel degenerative change. Moderate severe disc space narrowing C5-C6 and C6-C7 with moderate disc space narrowing at C7-T1. Hypertrophic facet degenerative changes left greater than right at multiple levels with foraminal narrowing. Upper chest: Negative. Other: None IMPRESSION: 1. No CT evidence for acute intracranial abnormality. Atrophy and chronic small vessel ischemic changes of the white matter 2. Multilevel degenerative changes of the cervical spine. No acute osseous abnormality Electronically Signed   By: Jasmine Pang M.D.   On: 02/26/2022 16:46        Scheduled Meds:  donepezil  5 mg Oral QHS   feeding supplement  237 mL Oral BID BM   [START ON 02/28/2022] ferrous sulfate  325 mg Oral Q breakfast   insulin aspart  0-9 Units Subcutaneous TID WC   memantine  5 mg Oral BID   metoprolol tartrate  25 mg Oral BID   [START ON 02/28/2022] multivitamin with minerals  1 tablet Oral Daily   senna  1 tablet Oral BID   Continuous Infusions:  sodium chloride 75 mL/hr at 02/27/22 1304   sodium chloride 75 mL/hr at 02/27/22 1027    ceFAZolin (ANCEF) IV 2 g (02/27/22 1306)     LOS: 1 day    Time spent: 35 minutes.     Alba Cory, MD [facility]   If 7PM-7AM, please contact night-coverage [email or web address]  02/27/2022, 2:08 PM

## 2022-02-27 NOTE — Anesthesia Preprocedure Evaluation (Signed)
Anesthesia Evaluation  Patient identified by MRN, date of birth, ID band Patient awake and Patient confused  General Assessment Comment:Patient Aox2, oriented to self (unsure of the day of her birthday), not oriented to place.  Spoke with daughter Caren Macadam by phone for consent.  Patient has what appears to be dried blood in her mouth, she is unsure why.  Reviewed: Allergy & Precautions, NPO status , Patient's Chart, lab work & pertinent test results, Unable to perform ROS - Chart review only  History of Anesthesia Complications Negative for: history of anesthetic complications  Airway Mallampati: III  TM Distance: >3 FB Neck ROM: Full   Comment: Dried blood in mouth Dental  (+) Teeth Intact   Pulmonary neg pulmonary ROS, neg sleep apnea, neg COPD, Patient abstained from smoking.Not current smoker   Pulmonary exam normal breath sounds clear to auscultation       Cardiovascular Exercise Tolerance: Good METShypertension, Pt. on medications + DVT  (-) CAD and (-) Past MI (-) dysrhythmias  Rhythm:Regular Rate:Normal - Systolic murmurs    Neuro/Psych  PSYCHIATRIC DISORDERS     Dementia negative neurological ROS     GI/Hepatic ,neg GERD  ,,(+)     (-) substance abuse    Endo/Other  diabetes    Renal/GU negative Renal ROS     Musculoskeletal   Abdominal   Peds  Hematology   Anesthesia Other Findings Past Medical History: No date: Diabetes mellitus without complication (HCC)  Reproductive/Obstetrics                             Anesthesia Physical Anesthesia Plan  ASA: 3  Anesthesia Plan: General   Post-op Pain Management: Ofirmev IV (intra-op)*   Induction: Intravenous  PONV Risk Score and Plan: 3 and Ondansetron, Dexamethasone and Treatment may vary due to age or medical condition  Airway Management Planned: Oral ETT  Additional Equipment: None  Intra-op Plan:    Post-operative Plan: Extubation in OR  Informed Consent: I have reviewed the patients History and Physical, chart, labs and discussed the procedure including the risks, benefits and alternatives for the proposed anesthesia with the patient or authorized representative who has indicated his/her understanding and acceptance.     Dental advisory given  Plan Discussed with: CRNA and Surgeon  Anesthesia Plan Comments: (Discussed risks of anesthesia with patient's daughter Caren Macadam by phone, including PONV, sore throat, lip/dental/eye damage, post operative cognitive dysfunction. Rare risks discussed as well, such as cardiorespiratory and neurological sequelae, blood loss requiring transfusion, and allergic reactions. Discussed the role of CRNA in patient's perioperative care. She understands.)       Anesthesia Quick Evaluation

## 2022-02-27 NOTE — Plan of Care (Signed)
  Problem: Education: Goal: Knowledge of General Education information will improve Description: Including pain rating scale, medication(s)/side effects and non-pharmacologic comfort measures Outcome: Progressing   Problem: Health Behavior/Discharge Planning: Goal: Ability to manage health-related needs will improve Outcome: Progressing   Problem: Clinical Measurements: Goal: Ability to maintain clinical measurements within normal limits will improve Outcome: Progressing   Problem: Clinical Measurements: Goal: Respiratory complications will improve Outcome: Progressing   Problem: Clinical Measurements: Goal: Cardiovascular complication will be avoided Outcome: Progressing   Problem: Activity: Goal: Risk for activity intolerance will decrease Outcome: Progressing   Problem: Nutrition: Goal: Adequate nutrition will be maintained Outcome: Adequate for Discharge   Problem: Coping: Goal: Level of anxiety will decrease Outcome: Adequate for Discharge   Problem: Elimination: Goal: Will not experience complications related to bowel motility Outcome: Progressing   Problem: Elimination: Goal: Will not experience complications related to urinary retention Outcome: Progressing   Problem: Pain Managment: Goal: General experience of comfort will improve Outcome: Progressing   Problem: Safety: Goal: Ability to remain free from injury will improve Outcome: Progressing   Problem: Skin Integrity: Goal: Risk for impaired skin integrity will decrease Outcome: Adequate for Discharge

## 2022-02-27 NOTE — Progress Notes (Signed)
Daughter called 3 times at phone number [phone] in regards to completing admission assessment.  This nurse left a voicemail message and attempted to complete admission assessment utilizing information in chart.

## 2022-02-27 NOTE — Progress Notes (Signed)
Daughter returned call and assisted in completing admission.

## 2022-02-28 DIAGNOSIS — S72001A Fracture of unspecified part of neck of right femur, initial encounter for closed fracture: Secondary | ICD-10-CM | POA: Diagnosis not present

## 2022-02-28 DIAGNOSIS — L8990 Pressure ulcer of unspecified site, unspecified stage: Secondary | ICD-10-CM | POA: Insufficient documentation

## 2022-02-28 LAB — BASIC METABOLIC PANEL
Anion gap: 5 (ref 5–15)
BUN: 26 mg/dL — ABNORMAL HIGH (ref 8–23)
CO2: 20 mmol/L — ABNORMAL LOW (ref 22–32)
Calcium: 7.9 mg/dL — ABNORMAL LOW (ref 8.9–10.3)
Chloride: 114 mmol/L — ABNORMAL HIGH (ref 98–111)
Creatinine, Ser: 1.13 mg/dL — ABNORMAL HIGH (ref 0.44–1.00)
GFR, Estimated: 47 mL/min — ABNORMAL LOW (ref >60)
Glucose, Bld: 220 mg/dL — ABNORMAL HIGH (ref 70–99)
Potassium: 3.9 mmol/L (ref 3.5–5.1)
Sodium: 139 mmol/L (ref 135–145)

## 2022-02-28 LAB — CBC
HCT: 26.9 % — ABNORMAL LOW (ref 36.0–46.0)
Hemoglobin: 9.1 g/dL — ABNORMAL LOW (ref 12.0–15.0)
MCH: 30.1 pg (ref 26.0–34.0)
MCHC: 33.8 g/dL (ref 30.0–36.0)
MCV: 89.1 fL (ref 80.0–100.0)
Platelets: 195 10*3/uL (ref 150–400)
RBC: 3.02 MIL/uL — ABNORMAL LOW (ref 3.87–5.11)
RDW: 12.3 % (ref 11.5–15.5)
WBC: 9.7 10*3/uL (ref 4.0–10.5)
nRBC: 0 % (ref 0.0–0.2)

## 2022-02-28 LAB — GLUCOSE, CAPILLARY
Glucose-Capillary: 222 mg/dL — ABNORMAL HIGH (ref 70–99)
Glucose-Capillary: 284 mg/dL — ABNORMAL HIGH (ref 70–99)
Glucose-Capillary: 239 mg/dL — ABNORMAL HIGH (ref 70–99)
Glucose-Capillary: 281 mg/dL — ABNORMAL HIGH (ref 70–99)

## 2022-02-28 MED ORDER — DIPHENHYDRAMINE HCL 25 MG PO CAPS
25.0000 mg | ORAL_CAPSULE | Freq: Once | ORAL | Status: AC
  Administered 2022-03-01: 25 mg via ORAL
  Filled 2022-02-28: qty 1

## 2022-02-28 MED ORDER — INSULIN DETEMIR 100 UNIT/ML ~~LOC~~ SOLN
8.0000 [IU] | Freq: Every day | SUBCUTANEOUS | Status: DC
  Administered 2022-02-28 – 2022-03-01 (×2): 8 [IU] via SUBCUTANEOUS
  Filled 2022-02-28 (×3): qty 0.08

## 2022-02-28 MED ORDER — APIXABAN 5 MG PO TABS
5.0000 mg | ORAL_TABLET | Freq: Two times a day (BID) | ORAL | Status: DC
  Administered 2022-02-28 – 2022-03-03 (×7): 5 mg via ORAL
  Filled 2022-02-28 (×7): qty 1

## 2022-02-28 NOTE — Progress Notes (Signed)
       CROSS COVER NOTE  NAME: Sonya Jones MRN: [id number] DOB : Sep 25, 1932 ATTENDING PHYSICIAN: Elmarie Shiley, MD    Date of Service   02/28/2022   HPI/Events of Note   Pruritus  Interventions   Assessment/Plan: Benadryl X X      This document was prepared using Dragon voice recognition software and may include unintentional dictation errors.  Neomia Glass DNP, MBA, FNP-BC Nurse Practitioner [facility] Pager [phone]

## 2022-02-28 NOTE — Progress Notes (Addendum)
PROGRESS NOTE    Sonya ConchMary Jones  JXB:[id number] DOB: [birth year] DOA: 02/26/2022 PCP: Normajean GlasgowPacifico, Micah, FNP   Brief Narrative: 88 year old with past medical history significant for diabetes type 2, vascular dementia, DVT on Eliquis presented to the ED from SNF with acute onset of accidental mechanical fall while she was bending over to pick up a book, subsequently fell on her right side and hit her right hip.  CT head negative for acute intracranial abnormality.  Chest x-ray no acute cardiopulmonary disease.  Right hip CT revealed acute mildly impacted subcapital right femoral neck fracture with normal femoral head alignment.  5.4 cm hematoma within the subcutaneous soft tissue of the lateral right hip.  Orthopedic was consulted and patient underwent percutaneous fixation of the right femoral neck with four 6.5 millimeter screw on 02/27/2022 by Dr. Hyacinth MeekerMiller.   Assessment & Plan:   Principal Problem:   Closed right hip fracture (HCC) Active Problems:   Uncontrolled type 2 diabetes mellitus with hyperglycemia, without long-term current use of insulin (HCC)   Essential hypertension   Dementia without behavioral disturbance (HCC)   History of DVT (deep vein thrombosis)   Pressure injury of skin  1-Closed Right Hip fracture: Presented after mechanical fall CT hip: acute mildly impacted subcapital right femoral neck fracture with normal femoral head alignment.  5.4 cm hematoma within the subcutaneous soft tissue of the lateral right hip. Eliquis has been on hold. Dr. Hyacinth MeekerMiller consulted and patient underwent percutaneous fixation of the right femoral neck with four 6.5 millimeter screw on 02/27/2022  DVT prophylaxis per otho.  PT per ortho.  Bowel regimen.   2-Diabetes type 2 uncontrolled with hyperglycemia Continue with sliding scale insulin Hold metformin Will start low dose basal insulin.   Acute Blood loss anemia;  Post surgery expected and in the setting hip hematoma post fall.  Monitor  hb. Hb on admission 12---down to 9  Recent diagnosed of DVT:  By doppler 02/17/2022: Extensive occlusive deep venous thrombosis in the right lower  extremity from the common femoral vein to the popliteal vein.  -ok to resume Eliquis per Ortho.  Monitor Hb.   Dementia Continue with Aricept and Namenda Avoid Haldol Delirium precaution  Hypertension; Continue with metoprolol  Nutrition Problem: Increased nutrient needs Etiology: hip fracture, post-op healing    Signs/Symptoms: estimated needs    Interventions: Ensure Enlive (each supplement provides 350kcal and 20 grams of protein), MVI  There is no height or weight on file to calculate BMI.   DVT prophylaxis: SCD Code Status: Full code Family Communication: Granddaughter over phone.  Status is: Inpatient Remains inpatient appropriate because: management hip fracture    Consultants:  Ortho  Procedures:    Antimicrobials:    Subjective: She is alert, pleasantly confuse. Denies pain.    Objective: Vitals:   02/27/22 1258 02/27/22 1639 02/27/22 2014 02/28/22 0836  BP: (!) 122/55 132/75 130/60 (!) 97/51  Pulse: 72 81 91 62  Resp: 14 16 16 16   Temp:   98.8 F (37.1 C) 97.9 F (36.6 C)  TempSrc:      SpO2: 98% 99% 97% 97%    Intake/Output Summary (Last 24 hours) at 02/28/2022 0901 Last data filed at 02/28/2022 0431 Gross per 24 hour  Intake 1006.74 ml  Output 272 ml  Net 734.74 ml    There were no vitals filed for this visit.  Examination:  General exam: NAD Respiratory system: CTA Cardiovascular system: S 1, S 2 RRR Gastrointestinal system: BS present, soft, nt Central nervous  system: Alert, pleasantly confuse.  Extremities: no edema, right hip with dressing.   Data Reviewed: I have personally reviewed following labs and imaging studies  CBC: Recent Labs  Lab 02/26/22 1740 02/28/22 0604  WBC 9.4 9.7  NEUTROABS 7.9*  --   HGB 12.9 9.1*  HCT 39.4 26.9*  MCV 90.8 89.1  PLT 221 195     Basic Metabolic Panel: Recent Labs  Lab 02/26/22 1820 02/28/22 0604  NA 137 139  K 3.6 3.9  CL 106 114*  CO2 20* 20*  GLUCOSE 343* 220*  BUN 32* 26*  CREATININE 1.26* 1.13*  CALCIUM 9.2 7.9*    GFR: CrCl cannot be calculated (Unknown ideal weight.). Liver Function Tests: No results for input(s): "AST", "ALT", "ALKPHOS", "BILITOT", "PROT", "ALBUMIN" in the last 168 hours. No results for input(s): "LIPASE", "AMYLASE" in the last 168 hours. No results for input(s): "AMMONIA" in the last 168 hours. Coagulation Profile: Recent Labs  Lab 02/26/22 1820  INR 1.9*    Cardiac Enzymes: No results for input(s): "CKTOTAL", "CKMB", "CKMBINDEX", "TROPONINI" in the last 168 hours. BNP (last 3 results) No results for input(s): "PROBNP" in the last 8760 hours. HbA1C: Recent Labs    02/26/22 1740  HGBA1C 9.6*   CBG: Recent Labs  Lab 02/27/22 0857 02/27/22 1150 02/27/22 1639 02/27/22 2041 02/28/22 0828  GLUCAP 167* 152* 227* 344* 222*    Lipid Profile: No results for input(s): "CHOL", "HDL", "LDLCALC", "TRIG", "CHOLHDL", "LDLDIRECT" in the last 72 hours. Thyroid Function Tests: No results for input(s): "TSH", "T4TOTAL", "FREET4", "T3FREE", "THYROIDAB" in the last 72 hours. Anemia Panel: No results for input(s): "VITAMINB12", "FOLATE", "FERRITIN", "TIBC", "IRON", "RETICCTPCT" in the last 72 hours. Sepsis Labs: No results for input(s): "PROCALCITON", "LATICACIDVEN" in the last 168 hours.  Recent Results (from the past 240 hour(s))  Resp Panel by RT-PCR (Flu A&B, Covid) Anterior Nasal Swab     Status: None   Collection Time: 02/26/22  4:11 PM   Specimen: Anterior Nasal Swab  Result Value Ref Range Status   SARS Coronavirus 2 by RT PCR NEGATIVE NEGATIVE Final    Comment: (NOTE) SARS-CoV-2 target nucleic acids are NOT DETECTED.  The SARS-CoV-2 RNA is generally detectable in upper respiratory specimens during the acute phase of infection. The lowest concentration of  SARS-CoV-2 viral copies this assay can detect is 138 copies/mL. A negative result does not preclude SARS-Cov-2 infection and should not be used as the sole basis for treatment or other patient management decisions. A negative result may occur with  improper specimen collection/handling, submission of specimen other than nasopharyngeal swab, presence of viral mutation(s) within the areas targeted by this assay, and inadequate number of viral copies(<138 copies/mL). A negative result must be combined with clinical observations, patient history, and epidemiological information. The expected result is Negative.  Fact Sheet for Patients:  EntrepreneurPulse.com.au  Fact Sheet for Healthcare Providers:  IncredibleEmployment.be  This test is no t yet approved or cleared by the [place] FDA and  has been authorized for detection and/or diagnosis of SARS-CoV-2 by FDA under an Emergency Use Authorization (EUA). This EUA will remain  in effect (meaning this test can be used) for the duration of the COVID-19 declaration under Section 564(b)(1) of the Act, 21 U.S.C.section 360bbb-3(b)(1), unless the authorization is terminated  or revoked sooner.       Influenza A by PCR NEGATIVE NEGATIVE Final   Influenza B by PCR NEGATIVE NEGATIVE Final    Comment: (NOTE) The Xpert Xpress SARS-CoV-2/FLU/RSV plus  assay is intended as an aid in the diagnosis of influenza from Nasopharyngeal swab specimens and should not be used as a sole basis for treatment. Nasal washings and aspirates are unacceptable for Xpert Xpress SARS-CoV-2/FLU/RSV testing.  Fact Sheet for Patients: BloggerCourse.com  Fact Sheet for Healthcare Providers: SeriousBroker.it  This test is not yet approved or cleared by the [place] FDA and has been authorized for detection and/or diagnosis of SARS-CoV-2 by FDA under an Emergency Use  Authorization (EUA). This EUA will remain in effect (meaning this test can be used) for the duration of the COVID-19 declaration under Section 564(b)(1) of the Act, 21 U.S.C. section 360bbb-3(b)(1), unless the authorization is terminated or revoked.  Performed at [facility], [address] [place], [place] [address]   MRSA Next Gen by PCR, Nasal     Status: None   Collection Time: 02/27/22  6:42 AM   Specimen: Nasal Mucosa; Nasal Swab  Result Value Ref Range Status   MRSA by PCR Next Gen NOT DETECTED NOT DETECTED Final    Comment: (NOTE) The GeneXpert MRSA Assay (FDA approved for NASAL specimens only), is one component of a comprehensive MRSA colonization surveillance program. It is not intended to diagnose MRSA infection nor to guide or monitor treatment for MRSA infections. Test performance is not FDA approved in patients less than 12 years old. Performed at [facility], [address] [place], [place] [address]          Radiology Studies: DG HIP UNILAT WITH PELVIS 2-3 VIEWS RIGHT  Result Date: 02/27/2022 CLINICAL DATA:  Surgical internal fixation of right hip fracture. EXAM: DG HIP (WITH OR WITHOUT PELVIS) 2-3V RIGHT; DG C-ARM 1-60 MIN-NO REPORT Fluoroscopy time: 59 seconds. COMPARISON:  February 26, 2022. FINDINGS: Two intraoperative fluoroscopic images were obtained of the right hip. These images demonstrate surgical internal fixation of proximal right femoral fracture. IMPRESSION: Fluoroscopic guidance provided during surgical internal fixation of proximal right femoral fracture. Electronically Signed   By: Lupita Raider M.D.   On: 02/27/2022 12:14   DG C-Arm 1-60 Min-No Report  Result Date: 02/27/2022 Fluoroscopy was utilized by the requesting physician.  No radiographic interpretation.   CT Hip Right Wo Contrast  Result Date: 02/26/2022 CLINICAL DATA:  Mechanical fall EXAM: CT OF THE RIGHT HIP WITHOUT CONTRAST TECHNIQUE: Multidetector CT  imaging of the right hip was performed according to the standard protocol. Multiplanar CT image reconstructions were also generated. RADIATION DOSE REDUCTION: This exam was performed according to the departmental dose-optimization program which includes automated exposure control, adjustment of the mA and/or kV according to patient size and/or use of iterative reconstruction technique. COMPARISON:  Radiograph 02/26/2022 FINDINGS: Bones/Joint/Cartilage Acute mildly impacted subcapital right femoral neck fracture. No femoral head dislocation. Mild degenerative change of the right hip with joint space narrowing and spurring. No sizable hip effusion. Ligaments Suboptimally assessed by CT. Muscles and Tendons No intramuscular collections.  No significant atrophy. Soft tissues Edema within the right hip soft tissues. Slightly dense masses within the subcutaneous soft tissues of the lateral right hip measuring 5.4 by 4 cm consistent with hematoma. IMPRESSION: 1. Acute mildly impacted subcapital right femoral neck fracture with normal femoral head alignment 2. 5.4 cm hematoma within the subcutaneous soft tissues of the lateral right hip. Electronically Signed   By: Jasmine Pang M.D.   On: 02/26/2022 19:46   DG Hip Unilat W or Wo Pelvis 2-3 Views Right  Result Date: 02/26/2022 CLINICAL DATA:  Fall.  Evaluate  for right hip fracture. EXAM: DG HIP (WITH OR WITHOUT PELVIS) 2-3V RIGHT COMPARISON:  CT abdomen and pelvis 02/16/2022 FINDINGS: There is diffuse decreased bone mineralization. Moderate bilateral sacroiliac subchondral sclerosis. Mild bilateral superior femoroacetabular joint space narrowing and peripheral acetabular degenerative osteophytes. There is new acute angulation and mild cortical step-off of the lateral right femoral head-neck junction indicating an acute proximal femoral neck fracture. Minimal medial displacement of the right femoral neck with respect of the right femoral head. Severe right and moderate  left L4-5 disc space narrowing and peripheral endplate osteophytes. IMPRESSION: 1. Acute, mildly displaced right femoral neck fracture. 2. Severe right and moderate left L4-5 degenerative disc and facet changes. Electronically Signed   By: Neita Garnet M.D.   On: 02/26/2022 17:05   DG Wrist Complete Left  Result Date: 02/26/2022 CLINICAL DATA:  Fall EXAM: LEFT WRIST - COMPLETE 3+ VIEW COMPARISON:  None Available. FINDINGS: Advanced degenerative changes at the 1st carpometacarpal joint. Moderate arthritic changes in the radiocarpal joint. No acute bony abnormality. Specifically, no fracture, subluxation, or dislocation. IMPRESSION: No acute bony abnormality. Electronically Signed   By: Charlett Nose M.D.   On: 02/26/2022 17:05   DG Chest 1 View  Result Date: 02/26/2022 CLINICAL DATA:  Fall, right hip fracture EXAM: CHEST  1 VIEW COMPARISON:  02/16/2022 FINDINGS: Heart and mediastinal contours are within normal limits. No focal opacities or effusions. No acute bony abnormality. IMPRESSION: No active disease. Electronically Signed   By: Charlett Nose M.D.   On: 02/26/2022 17:04   CT HEAD WO CONTRAST ( )  Result Date: 02/26/2022 CLINICAL DATA:  Dementia, fall EXAM: CT HEAD WITHOUT CONTRAST CT CERVICAL SPINE WITHOUT CONTRAST TECHNIQUE: Multidetector CT imaging of the head and cervical spine was performed following the standard protocol without intravenous contrast. Multiplanar CT image reconstructions of the cervical spine were also generated. RADIATION DOSE REDUCTION: This exam was performed according to the departmental dose-optimization program which includes automated exposure control, adjustment of the mA and/or kV according to patient size and/or use of iterative reconstruction technique. COMPARISON:  CT brain 02/16/2022 FINDINGS: CT HEAD FINDINGS Brain: No acute territorial infarction, hemorrhage or intracranial mass. Atrophy. Patchy white matter hypodensity consistent with chronic small vessel  ischemic change. Stable ventricle size. Vascular: No hyperdense vessels. Vertebral and carotid vascular calcification Skull: Normal. Negative for fracture or focal lesion. Sinuses/Orbits: No acute finding. Other: None CT CERVICAL SPINE FINDINGS Alignment: No subluxation.  Facet alignment is within normal limits. Skull base and vertebrae: No acute fracture. No primary bone lesion or focal pathologic process. Soft tissues and spinal canal: No prevertebral fluid or swelling. No visible canal hematoma. Disc levels: Multilevel degenerative change. Moderate severe disc space narrowing C5-C6 and C6-C7 with moderate disc space narrowing at C7-T1. Hypertrophic facet degenerative changes left greater than right at multiple levels with foraminal narrowing. Upper chest: Negative. Other: None IMPRESSION: 1. No CT evidence for acute intracranial abnormality. Atrophy and chronic small vessel ischemic changes of the white matter 2. Multilevel degenerative changes of the cervical spine. No acute osseous abnormality Electronically Signed   By: Jasmine Pang M.D.   On: 02/26/2022 16:46   CT Cervical Spine Wo Contrast  Result Date: 02/26/2022 CLINICAL DATA:  Dementia, fall EXAM: CT HEAD WITHOUT CONTRAST CT CERVICAL SPINE WITHOUT CONTRAST TECHNIQUE: Multidetector CT imaging of the head and cervical spine was performed following the standard protocol without intravenous contrast. Multiplanar CT image reconstructions of the cervical spine were also generated. RADIATION DOSE REDUCTION: This exam was performed  according to the departmental dose-optimization program which includes automated exposure control, adjustment of the mA and/or kV according to patient size and/or use of iterative reconstruction technique. COMPARISON:  CT brain 02/16/2022 FINDINGS: CT HEAD FINDINGS Brain: No acute territorial infarction, hemorrhage or intracranial mass. Atrophy. Patchy white matter hypodensity consistent with chronic small vessel ischemic change.  Stable ventricle size. Vascular: No hyperdense vessels. Vertebral and carotid vascular calcification Skull: Normal. Negative for fracture or focal lesion. Sinuses/Orbits: No acute finding. Other: None CT CERVICAL SPINE FINDINGS Alignment: No subluxation.  Facet alignment is within normal limits. Skull base and vertebrae: No acute fracture. No primary bone lesion or focal pathologic process. Soft tissues and spinal canal: No prevertebral fluid or swelling. No visible canal hematoma. Disc levels: Multilevel degenerative change. Moderate severe disc space narrowing C5-C6 and C6-C7 with moderate disc space narrowing at C7-T1. Hypertrophic facet degenerative changes left greater than right at multiple levels with foraminal narrowing. Upper chest: Negative. Other: None IMPRESSION: 1. No CT evidence for acute intracranial abnormality. Atrophy and chronic small vessel ischemic changes of the white matter 2. Multilevel degenerative changes of the cervical spine. No acute osseous abnormality Electronically Signed   By: Jasmine Pang M.D.   On: 02/26/2022 16:46        Scheduled Meds:  donepezil  5 mg Oral QHS   feeding supplement  237 mL Oral BID BM   ferrous sulfate  325 mg Oral Q breakfast   insulin aspart  0-9 Units Subcutaneous TID WC   memantine  5 mg Oral BID   metoprolol tartrate  25 mg Oral BID   multivitamin with minerals  1 tablet Oral Daily   senna  1 tablet Oral BID   Continuous Infusions:  sodium chloride 75 mL/hr at 02/27/22 1304     LOS: 2 days    Time spent: 35 minutes.     Alba Cory, MD [facility]   If 7PM-7AM, please contact night-coverage [email or web address]  02/28/2022, 9:01 AM

## 2022-02-28 NOTE — TOC Initial Note (Addendum)
Transition of Care [facility]) - Initial/Assessment Note    Patient Details  Name: Sonya Jones MRN: [id number] Date of Birth: 03-19-1933  [facility] CM/SW Contact:    Maree Krabbe, LCSW Phone Number: 02/28/2022, 10:07 AM  Clinical Narrative:    Pt came from [facility]. CSW attempted to reach pt's daughter - unable to reach her. CSW spoke with pt's granddaughter. Pt's granddaughter states they want pt to return to [facility] at d/c. CSW has reached out to Tammy with [facility] to determine any barriers with pt returning at d/c.              NO barries to return  Expected Discharge Plan: Skilled Nursing Facility Barriers to Discharge: Continued Medical Work up   Patient Goals and CMS Choice Patient states their goals for this hospitalization and ongoing recovery are:: per granddaughter- for pt to return to [facility]   Choice offered to / list presented to :  Publishing rights manager)  Expected Discharge Plan and Services Expected Discharge Plan: Skilled Nursing Facility In-house Referral: Clinical Social Work   Post Acute Care Choice: Skilled Nursing Facility Living arrangements for the past 2 months: Skilled Nursing Facility                                      Prior Living Arrangements/Services Living arrangements for the past 2 months: Skilled Nursing Facility Lives with:: Facility Resident Patient language and need for interpreter reviewed:: Yes Do you feel safe going back to the place where you live?: Yes      Need for Family Participation in Patient Care: Yes (Comment) Care giver support system in place?: Yes (comment)   Criminal Activity/Legal Involvement Pertinent to Current Situation/Hospitalization: No - Comment as needed  Activities of Daily Living Home Assistive Devices/Equipment: None ADL Screening (condition at time of admission) Patient's cognitive ability adequate to safely complete daily activities?: No Is the patient deaf or have difficulty hearing?:  No Does the patient have difficulty seeing, even when wearing glasses/contacts?: No Does the patient have difficulty concentrating, remembering, or making decisions?: Yes Patient able to express need for assistance with ADLs?: Yes Does the patient have difficulty dressing or bathing?: Yes Independently performs ADLs?: No Communication: Needs assistance Is this a change from baseline?: Pre-admission baseline Dressing (OT): Needs assistance Is this a change from baseline?: Pre-admission baseline Grooming: Needs assistance Is this a change from baseline?: Pre-admission baseline Feeding: Needs assistance Is this a change from baseline?: Pre-admission baseline Bathing: Dependent Is this a change from baseline?: Pre-admission baseline Toileting: Dependent Is this a change from baseline?: Pre-admission baseline In/Out Bed: Needs assistance Is this a change from baseline?: Pre-admission baseline Walks in Home: Dependent Is this a change from baseline?: Pre-admission baseline Does the patient have difficulty walking or climbing stairs?: Yes Weakness of Legs: Right Weakness of Arms/Hands: None  Permission Sought/Granted Permission sought to share information with : Family Supports    Share Information with NAME: Lyla Son     Permission granted to share info w Relationship: granddaughter     Emotional Assessment Appearance:: Appears stated age Attitude/Demeanor/Rapport: Unable to Assess Affect (typically observed): Unable to Assess Orientation: : Oriented to Self Alcohol / Substance Use: Not Applicable Psych Involvement: No (comment)  Admission diagnosis:  Closed right hip fracture (HCC) [S72.001A] Fall, initial encounter [W19.XXXA] Closed fracture of right hip, initial encounter [facility]) [S72.001A] Patient Active Problem List   Diagnosis Date Noted  Pressure injury of skin 02/28/2022   Closed right hip fracture ([place]) 02/26/2022   Uncontrolled type 2 diabetes mellitus with  hyperglycemia, without long-term current use of insulin ([place]) 02/26/2022   Essential hypertension 02/26/2022   Dementia without behavioral disturbance ([place]) 02/26/2022   History of DVT (deep vein thrombosis) 02/26/2022   PCP:  Francis Gaines, FNP Pharmacy:   [id number] [address], [place] [address] Phone: [phone] Fax: [phone]     Social Determinants of Health (SDOH) Interventions    Readmission Risk Interventions     No data to display

## 2022-02-28 NOTE — Plan of Care (Signed)
  Problem: Education: Goal: Knowledge of General Education information will improve Description: Including pain rating scale, medication(s)/side effects and non-pharmacologic comfort measures Outcome: Progressing   Problem: Health Behavior/Discharge Planning: Goal: Ability to manage health-related needs will improve Outcome: Progressing   Problem: Clinical Measurements: Goal: Ability to maintain clinical measurements within normal limits will improve Outcome: Progressing Goal: Will remain free from infection Outcome: Progressing Goal: Diagnostic test results will improve Outcome: Progressing Goal: Respiratory complications will improve Outcome: Progressing Goal: Cardiovascular complication will be avoided Outcome: Progressing   Problem: Activity: Goal: Risk for activity intolerance will decrease Outcome: Progressing   Problem: Nutrition: Goal: Adequate nutrition will be maintained Outcome: Progressing   Problem: Coping: Goal: Level of anxiety will decrease Outcome: Progressing   Problem: Elimination: Goal: Will not experience complications related to bowel motility Outcome: Progressing Goal: Will not experience complications related to urinary retention Outcome: Progressing   Problem: Pain Managment: Goal: General experience of comfort will improve Outcome: Progressing   Problem: Safety: Goal: Ability to remain free from injury will improve Outcome: Progressing   Problem: Skin Integrity: Goal: Risk for impaired skin integrity will decrease Outcome: Progressing   Problem: Education: Goal: Ability to describe self-care measures that may prevent or decrease complications (Diabetes Survival Skills Education) will improve Outcome: Progressing Goal: Individualized Educational Video(s) Outcome: Progressing   Problem: Coping: Goal: Ability to adjust to condition or change in health will improve Outcome: Progressing   Problem: Fluid Volume: Goal: Ability to  maintain a balanced intake and output will improve Outcome: Progressing   Problem: Health Behavior/Discharge Planning: Goal: Ability to identify and utilize available resources and services will improve Outcome: Progressing Goal: Ability to manage health-related needs will improve Outcome: Progressing   Problem: Metabolic: Goal: Ability to maintain appropriate glucose levels will improve Outcome: Progressing   Problem: Nutritional: Goal: Maintenance of adequate nutrition will improve Outcome: Progressing Goal: Progress toward achieving an optimal weight will improve Outcome: Progressing   Problem: Skin Integrity: Goal: Risk for impaired skin integrity will decrease Outcome: Progressing   Problem: Tissue Perfusion: Goal: Adequacy of tissue perfusion will improve Outcome: Progressing   Problem: Education: Goal: Verbalization of understanding the information provided (i.e., activity precautions, restrictions, etc) will improve Outcome: Progressing Goal: Individualized Educational Video(s) Outcome: Progressing   Problem: Activity: Goal: Ability to ambulate and perform ADLs will improve Outcome: Progressing   Problem: Clinical Measurements: Goal: Postoperative complications will be avoided or minimized Outcome: Progressing   Problem: Self-Concept: Goal: Ability to maintain and perform role responsibilities to the fullest extent possible will improve Outcome: Progressing   Problem: Pain Management: Goal: Pain level will decrease Outcome: Progressing   

## 2022-02-28 NOTE — Evaluation (Signed)
Occupational Therapy Evaluation Patient Details Name: Sonya Jones MRN: [id number] DOB: 1932/10/08 Today's Date: 02/28/2022   History of Present Illness Pt is an 88 year old female s/p percutaneous fixation of the right femoral neck with four 6.5 millimeter screw 02/27/22 after mechanical fall. PMH significant for diabetes type 2, vascular dementia, DVT on Eliquis   Clinical Impression   Chart reviewed, pt greeted in chair agreeable to OT evaluation. Pt is pleasantly confused, oriented to self only, follows one step directions with good adherence however poor adherence to TTWBing precautions due to cognition. Mobility is limited due to poor adherence to TTWBing precautions. Multi modal cues required throughout to attempt to facilitate adherence to precautions. STS completed with CGA, LB dressing with MIN A, grooming tasks with SET UP. Pt presents with deficits in activity tolerance, balance all affecting safe and optimal ADL completion. Recommend STR to address deficits following discharge. OT will continue to follow acutely.      Recommendations for follow up therapy are one component of a multi-disciplinary discharge planning process, led by the attending physician.  Recommendations may be updated based on patient status, additional functional criteria and insurance authorization.   Follow Up Recommendations  Skilled nursing-short term rehab (<3 hours/day)    Assistance Recommended at Discharge Frequent or constant Supervision/Assistance  Patient can return home with the following A lot of help with walking and/or transfers;A little help with bathing/dressing/bathroom;Direct supervision/assist for medications management    Functional Status Assessment  Patient has had a recent decline in their functional status and demonstrates the ability to make significant improvements in function in a reasonable and predictable amount of time.  Equipment Recommendations  Other (comment) (per next venue  of care)    Recommendations for Other Services       Precautions / Restrictions Precautions Precautions: Fall Restrictions Weight Bearing Restrictions: Yes RLE Weight Bearing: Touchdown weight bearing      Mobility Bed Mobility               General bed mobility comments: NT in chair pre/post session    Transfers Overall transfer level: Needs assistance Equipment used: Rolling walker (2 wheels) Transfers: Sit to/from Stand Sit to Stand: Min guard           General transfer comment: mutlpe attempts, multi modal cueing for adherence to TTWBing      Balance Overall balance assessment: Needs assistance Sitting-balance support: Feet supported, Bilateral upper extremity supported Sitting balance-Leahy Scale: Good     Standing balance support: Bilateral upper extremity supported, During functional activity, Reliant on assistive device for balance Standing balance-Leahy Scale: Fair                             ADL either performed or assessed with clinical judgement   ADL Overall ADL's : Needs assistance/impaired Eating/Feeding: Set up;Sitting   Grooming: Wash/dry hands;Wash/dry face;Sitting;Set up           Upper Body Dressing : Set up;Sitting   Lower Body Dressing: Minimal assistance Lower Body Dressing Details (indicate cue type and reason): socks Toilet Transfer: Supervision/safety;Min guard;Rolling walker (2 wheels) Toilet Transfer Details (indicate cue type and reason): simulated, step by step verbal/tactile cues for adherence to precautions         Functional mobility during ADLs: Min guard (approx 5' in room, pt could likely amb more however is unable to safety adhere to TTWBing precautions due to cognition)  Vision Patient Visual Report: No change from baseline       Perception     Praxis      Pertinent Vitals/Pain Pain Assessment Pain Assessment: No/denies pain     Hand Dominance Right   Extremity/Trunk  Assessment Upper Extremity Assessment Upper Extremity Assessment: Overall WFL for tasks assessed   Lower Extremity Assessment Lower Extremity Assessment: Generalized weakness   Cervical / Trunk Assessment Cervical / Trunk Assessment: Normal   Communication Communication Communication: No difficulties   Cognition Arousal/Alertness: Awake/alert Behavior During Therapy: Impulsive Overall Cognitive Status: No family/caregiver present to determine baseline cognitive functioning Area of Impairment: Orientation, Memory, Following commands, Safety/judgement, Awareness, Problem solving                 Orientation Level: Disoriented to, Place, Time, Situation ("why am I here?", "did I fall?")   Memory: Decreased recall of precautions, Decreased short-term memory Following Commands: Follows one step commands consistently Safety/Judgement: Decreased awareness of deficits, Decreased awareness of safety Awareness: Intellectual Problem Solving: Requires verbal cues, Requires tactile cues       General Comments       Exercises     Shoulder Instructions      Home Living Family/patient expects to be discharged to:: Skilled nursing facility                                        Prior Functioning/Environment               Mobility Comments: Pt reports she lives at home, poor historian          OT Problem List: Decreased activity tolerance;Decreased safety awareness;Decreased knowledge of use of DME or AE      OT Treatment/Interventions: Self-care/ADL training;Therapeutic exercise;Patient/family education;DME and/or AE instruction;Therapeutic activities    OT Goals(Current goals can be found in the care plan section) Acute Rehab OT Goals Patient Stated Goal: feel better OT Goal Formulation: With patient Time For Goal Achievement: 03/14/22 Potential to Achieve Goals: Good ADL Goals Pt Will Perform Lower Body Dressing: with supervision;sit to/from  stand Pt Will Transfer to Toilet: with supervision Pt Will Perform Toileting - Clothing Manipulation and hygiene: with supervision;sit to/from stand  OT Frequency: Min 2X/week    Co-evaluation              AM-PAC OT "6 Clicks" Daily Activity     Outcome Measure Help from another person eating meals?: None Help from another person taking care of personal grooming?: None Help from another person toileting, which includes using toliet, bedpan, or urinal?: A Little Help from another person bathing (including washing, rinsing, drying)?: A Little Help from another person to put on and taking off regular upper body clothing?: None Help from another person to put on and taking off regular lower body clothing?: A Little 6 Click Score: 21   End of Session Equipment Utilized During Treatment: Rolling walker (2 wheels) Nurse Communication: Mobility status  Activity Tolerance: Patient tolerated treatment well Patient left: in chair;with call bell/phone within reach;with chair alarm set  OT Visit Diagnosis: History of falling (Z91.81);Unsteadiness on feet (R26.81)                Time: 3662-9476 OT Time Calculation (min): 10 min Charges:  OT General Charges $OT Visit: 1 Visit OT Evaluation $OT Eval Low Complexity: 1 Low  Shanon Payor, OTD OTR/L  02/28/22, 10:21 AM

## 2022-02-28 NOTE — NC FL2 (Signed)
[place] LEVEL OF CARE SCREENING TOOL     IDENTIFICATION  Patient Name: Sonya Jones Birthdate: [birth year] Sex: female Admission Date (Current Location): 02/26/2022  [facility] [place] and [place] Number:  Engineering geologist and Address:  [facility], [address] [place], [place] [address]      Provider Number: [id number]  Attending Physician Name and Address:  Elmarie Shiley, MD  Relative Name and Phone Number:       Current Level of Care: Hospital Recommended Level of Care: [place] Prior Approval Number:    Date Approved/Denied:   PASRR Number:    Discharge Plan: SNF    Current Diagnoses: Patient Active Problem List   Diagnosis Date Noted   Pressure injury of skin 02/28/2022   Closed right hip fracture ([place]) 02/26/2022   Uncontrolled type 2 diabetes mellitus with hyperglycemia, without long-term current use of insulin ([place]) 02/26/2022   Essential hypertension 02/26/2022   Dementia without behavioral disturbance ([place]) 02/26/2022   History of DVT (deep vein thrombosis) 02/26/2022    Orientation RESPIRATION BLADDER Height & Weight     Self  Normal Incontinent Weight:   Height:     BEHAVIORAL SYMPTOMS/MOOD NEUROLOGICAL BOWEL NUTRITION STATUS      Continent Diet (regular diet)  AMBULATORY STATUS COMMUNICATION OF NEEDS Skin     Verbally PU Stage and Appropriate Care PU Stage 1 Dressing:  (coccyx, foam dressing)                     Personal Care Assistance Level of Assistance  Bathing, Feeding, Dressing Bathing Assistance: Limited assistance Feeding assistance: Independent Dressing Assistance: Limited assistance     Functional Limitations Info  Sight, Hearing, Speech Sight Info: Adequate Hearing Info: Adequate Speech Info: Adequate    SPECIAL CARE FACTORS FREQUENCY  OT (By licensed OT), PT (By licensed PT)     PT Frequency: 5x OT Frequency: 5x            Contractures Contractures Info:  Not present    Additional Factors Info  Code Status, Allergies Code Status Info: full code Allergies Info: no known allergies           Current Medications (02/28/2022):  This is the current hospital active medication list Current Facility-Administered Medications  Medication Dose Route Frequency Provider Last Rate Last Admin   acetaminophen (TYLENOL) tablet 325-650 mg  325-650 mg Oral Q6H PRN Earnestine Leys, MD       alum & mag hydroxide-simeth (MAALOX/MYLANTA) 200-200-20 MG/5ML suspension 30 mL  30 mL Oral Q4H PRN Earnestine Leys, MD       bisacodyl (DULCOLAX) suppository 10 mg  10 mg Rectal Daily PRN Earnestine Leys, MD       donepezil (ARICEPT) tablet 5 mg  5 mg Oral QHS Mansy, Jan A, MD   5 mg at 02/27/22 2142   feeding supplement (ENSURE ENLIVE / ENSURE PLUS) liquid 237 mL  237 mL Oral BID BM Regalado, Belkys A, MD   237 mL at 02/28/22 0858   ferrous sulfate tablet 325 mg  325 mg Oral Q breakfast Earnestine Leys, MD   325 mg at 02/28/22 0858   HYDROcodone-acetaminophen (NORCO) 7.5-325 MG per tablet 1-2 tablet  1-2 tablet Oral Q4H PRN Earnestine Leys, MD       HYDROcodone-acetaminophen (NORCO/VICODIN) 5-325 MG per tablet 1-2 tablet  1-2 tablet Oral Q4H PRN Earnestine Leys, MD   1 tablet at 02/28/22 0636   insulin aspart (novoLOG) injection  0-9 Units  0-9 Units Subcutaneous TID WC Regalado, Belkys A, MD   3 Units at 02/28/22 0848   magnesium hydroxide (MILK OF MAGNESIA) suspension 30 mL  30 mL Oral Daily PRN Mansy, Jan A, MD       memantine [facility]) tablet 5 mg  5 mg Oral BID Mansy, Jan A, MD   5 mg at 02/28/22 5329   menthol-cetylpyridinium (CEPACOL) lozenge 3 mg  1 lozenge Oral PRN Deeann Saint, MD       Or   phenol (CHLORASEPTIC) mouth spray 1 spray  1 spray Mouth/Throat PRN Deeann Saint, MD       metoCLOPramide (REGLAN) tablet 5-10 mg  5-10 mg Oral Q8H PRN Deeann Saint, MD       Or   metoCLOPramide (REGLAN) injection 5-10 mg  5-10 mg Intravenous Q8H PRN Deeann Saint, MD        metoprolol tartrate (LOPRESSOR) tablet 25 mg  25 mg Oral BID Mansy, Jan A, MD   25 mg at 02/28/22 0901   morphine (PF) 2 MG/ML injection 0.5-1 mg  0.5-1 mg Intravenous Q2H PRN Deeann Saint, MD       multivitamin with minerals tablet 1 tablet  1 tablet Oral Daily Regalado, Belkys A, MD   1 tablet at 02/28/22 0858   ondansetron (ZOFRAN) injection 4 mg  4 mg Intravenous Q4H PRN Mansy, Jan A, MD       risperiDONE (RISPERDAL) tablet 0.25 mg  0.25 mg Oral Daily PRN Regalado, Belkys A, MD   0.25 mg at 02/28/22 0858   senna (SENOKOT) tablet 8.6 mg  1 tablet Oral BID Deeann Saint, MD   8.6 mg at 02/28/22 0901   sodium phosphate (FLEET) 7-19 GM/118ML enema 1 enema  1 enema Rectal Once PRN Deeann Saint, MD       traZODone (DESYREL) tablet 25 mg  25 mg Oral QHS PRN Mansy, Jan A, MD   25 mg at 02/27/22 2142   zolpidem (AMBIEN) tablet 5 mg  5 mg Oral QHS PRN Deeann Saint, MD         Discharge Medications: Please see discharge summary for a list of discharge medications.  Relevant Imaging Results:  Relevant Lab Results:   Additional Information SSN:[id number]  Maree Krabbe, LCSW

## 2022-02-28 NOTE — Plan of Care (Signed)
  Problem: Education: Goal: Knowledge of General Education information will improve Description: Including pain rating scale, medication(s)/side effects and non-pharmacologic comfort measures Outcome: Progressing   Problem: Health Behavior/Discharge Planning: Goal: Ability to manage health-related needs will improve Outcome: Progressing   Problem: Clinical Measurements: Goal: Ability to maintain clinical measurements within normal limits will improve Outcome: Progressing Goal: Will remain free from infection Outcome: Progressing Goal: Diagnostic test results will improve Outcome: Progressing Goal: Respiratory complications will improve Outcome: Progressing Goal: Cardiovascular complication will be avoided Outcome: Progressing   Problem: Activity: Goal: Risk for activity intolerance will decrease Outcome: Progressing   Problem: Nutrition: Goal: Adequate nutrition will be maintained Outcome: Progressing   Problem: Coping: Goal: Level of anxiety will decrease Outcome: Progressing   Problem: Elimination: Goal: Will not experience complications related to bowel motility Outcome: Progressing Goal: Will not experience complications related to urinary retention Outcome: Progressing   Problem: Pain Managment: Goal: General experience of comfort will improve Outcome: Progressing   Problem: Skin Integrity: Goal: Risk for impaired skin integrity will decrease Outcome: Progressing   Problem: Education: Goal: Ability to describe self-care measures that may prevent or decrease complications (Diabetes Survival Skills Education) will improve Outcome: Progressing Goal: Individualized Educational Video(s) Outcome: Progressing   Problem: Coping: Goal: Ability to adjust to condition or change in health will improve Outcome: Progressing   Problem: Fluid Volume: Goal: Ability to maintain a balanced intake and output will improve Outcome: Progressing   Problem: Health  Behavior/Discharge Planning: Goal: Ability to identify and utilize available resources and services will improve Outcome: Progressing Goal: Ability to manage health-related needs will improve Outcome: Progressing   Problem: Metabolic: Goal: Ability to maintain appropriate glucose levels will improve Outcome: Progressing   Problem: Nutritional: Goal: Maintenance of adequate nutrition will improve Outcome: Progressing Goal: Progress toward achieving an optimal weight will improve Outcome: Progressing   Problem: Skin Integrity: Goal: Risk for impaired skin integrity will decrease Outcome: Progressing   Problem: Tissue Perfusion: Goal: Adequacy of tissue perfusion will improve Outcome: Progressing   Problem: Education: Goal: Verbalization of understanding the information provided (i.e., activity precautions, restrictions, etc) will improve Outcome: Progressing Goal: Individualized Educational Video(s) Outcome: Progressing   Problem: Activity: Goal: Ability to ambulate and perform ADLs will improve Outcome: Progressing   Problem: Clinical Measurements: Goal: Postoperative complications will be avoided or minimized Outcome: Progressing   Problem: Self-Concept: Goal: Ability to maintain and perform role responsibilities to the fullest extent possible will improve Outcome: Progressing   Problem: Pain Management: Goal: Pain level will decrease Outcome: Progressing

## 2022-02-28 NOTE — Progress Notes (Signed)
Subjective: 1 Day Post-Op Procedure(s) (LRB): PERCUTANEOUS FIXATION OF FEMORAL NECK (Right) Patient is out of bed in chair.  She is alert but confused.  PT indicates that she tried to walk with full weightbearing on the leg and I have cautioned him to try and minimize her weight on the leg.  Hemoglobin is stable at 9.1.  Patient reports pain as mild.  Objective:   VITALS:   Vitals:   02/27/22 2014 02/28/22 0836  BP: 130/60 (!) 97/51  Pulse: 91 62  Resp: 16 16  Temp: 98.8 F (37.1 C) 97.9 F (36.6 C)  SpO2: 97% 97%    Neurologically intact Incision: dressing C/D/I and the compartments are soft.  There is no evidence of significant further bruising that was present from the original injury.  Neurovascular status is good.  LABS Recent Labs    02/26/22 1740 02/28/22 0604  HGB 12.9 9.1*  HCT 39.4 26.9*  WBC 9.4 9.7  PLT 221 195    Recent Labs    02/26/22 1820 02/28/22 0604  NA 137 139  K 3.6 3.9  BUN 32* 26*  CREATININE 1.26* 1.13*  GLUCOSE 343* 220*    Recent Labs    02/26/22 1820  INR 1.9*     Assessment/Plan: 1 Day Post-Op Procedure(s) (LRB): PERCUTANEOUS FIXATION OF FEMORAL NECK (Right)   Advance diet Up with therapy D/C IV fluids Discharge to SNF when medically stable. Restart Eliquis Minimize weightbearing on right leg is much as possible Return to my clinic in 2 weeks after surgery.

## 2022-02-28 NOTE — Progress Notes (Signed)
Pt up multiple times from recliner in the room. Not understanding safety awareness. Door is open, chair alarm is in place, call light and phone in reach. Frequent checks by staff.

## 2022-02-28 NOTE — Evaluation (Signed)
Physical Therapy Evaluation Patient Details Name: Sonya Jones MRN: [id number] DOB: 07/03/1932 Today's Date: 02/28/2022  History of Present Illness  Pt is an 88 y/o F admitted on 02/26/22 after presenting following a fall at SNF. Pt fell on her R side. Head CT was negative. R hip CT positive for acute mildly impacted subcapital R femoral neck fx & hematoma. Pt underwent percutaneous fixation of R femoral neck with four 6.5 millimeter screws by Dr. Sabra Heck on 02/27/22. PMH: DM2, vascular dementia, DVT on eliquis  Clinical Impression  Pt seen for PT evaluation with pt agreeable to tx. Pt with hx of dementia & on this date is AxO to self only but able to use whiteboard in room to recall name of place & date without cuing to do so. PT educates pt on injury & weight bearing precautions but pt requires frequent, ongoing cuing throughout session. Pt is able to complete bed mobility with supervision, STS with CGA with cuing for hand placement & BLE placement, as well as overall technique. Pt is able to complete step pivot transfers with RW & min assist & ambulate short distance in room with RW & min increasing to mod assist, as pt requires more assistance for RW management/turning. Pt denies RLE pain throughout session but due to impaired cognition pt will require frequent supervision/cuing to maintain weight bearing precautions during mobility. Pt would benefit from STR upon d/c to maximize independence with functional mobility. Will continue to follow pt acutely to address balance and gait with LRAD.    Recommendations for follow up therapy are one component of a multi-disciplinary discharge planning process, led by the attending physician.  Recommendations may be updated based on patient status, additional functional criteria and insurance authorization.  Follow Up Recommendations Skilled nursing-short term rehab (<3 hours/day) Can patient physically be transported by private vehicle: Yes    Assistance  Recommended at Discharge Frequent or constant Supervision/Assistance  Patient can return home with the following  A lot of help with walking and/or transfers;A lot of help with bathing/dressing/bathroom;Assist for transportation    Equipment Recommendations None recommended by PT  Recommendations for Other Services  OT consult    Functional Status Assessment Patient has had a recent decline in their functional status and demonstrates the ability to make significant improvements in function in a reasonable and predictable amount of time.     Precautions / Restrictions Precautions Precautions: Fall Restrictions Weight Bearing Restrictions: Yes RLE Weight Bearing: Touchdown weight bearing      Mobility  Bed Mobility Overal bed mobility: Needs Assistance Bed Mobility: Supine to Sit     Supine to sit: Supervision, HOB elevated     General bed mobility comments: HOB slightly elevated, use of bed rails    Transfers Overall transfer level: Needs assistance Equipment used: Rolling walker (2 wheels) Transfers: Sit to/from Stand, Bed to chair/wheelchair/BSC Sit to Stand: Min guard   Step pivot transfers: Min assist (cuing for weight bearing precautions RLE, assistance with RW management, decreased awareness of need to reach back for recliner during stand>sit)       General transfer comment: cuing for hand placement & to maintain decreased weight bearing/NWB RLE    Ambulation/Gait Ambulation/Gait assistance: Min assist, Mod assist Gait Distance (Feet): 8 Feet Assistive device: Rolling walker (2 wheels) Gait Pattern/deviations: Decreased step length - left, Step-to pattern Gait velocity: decreased     General Gait Details: Pt with difficulty weight bearing through BUE to advance LLE & hops on LLE with decreased step  length LLE & decreased foot clearance. Pt requires cuing to ambulate within base of AD & maintain NWB RLE. Pt requires assistance for RW management, especially  when turning. Pt requires min increasing to mod assist 2/2 decreased balance.  Stairs            Wheelchair Mobility    Modified Rankin (Stroke Patients Only)       Balance Overall balance assessment: Needs assistance Sitting-balance support: Feet supported, Bilateral upper extremity supported Sitting balance-Leahy Scale: Good Sitting balance - Comments: static sitting EOB without LOB   Standing balance support: Bilateral upper extremity supported, During functional activity, Reliant on assistive device for balance Standing balance-Leahy Scale: Fair                               Pertinent Vitals/Pain Pain Assessment Pain Assessment: No/denies pain    Home Living Family/patient expects to be discharged to:: Skilled nursing facility                        Prior Function               Mobility Comments: Pt is poor historian & unable to provide.       Hand Dominance        Extremity/Trunk Assessment   Upper Extremity Assessment Upper Extremity Assessment: Overall WFL for tasks assessed    Lower Extremity Assessment Lower Extremity Assessment: Overall WFL for tasks assessed;Generalized weakness       Communication   Communication: No difficulties  Cognition Arousal/Alertness: Awake/alert Behavior During Therapy: WFL for tasks assessed/performed, Impulsive Overall Cognitive Status: History of cognitive impairments - at baseline                                 General Comments: Pt with hx of dementia. Pt oriented to self, aware enough to utilize white board in room to assist with location & date. Pt follows simple commands inconsistently with extra time throughout session, poor anticipatory awareness, decreased recall. Requires cuing to maintain TDWB throughout mobility.        General Comments      Exercises General Exercises - Lower Extremity Long Arc Quad: AROM, Strengthening, Right, 15 reps, Seated Hip  Flexion/Marching: AROM, Strengthening, Right, Seated, 15 reps   Assessment/Plan    PT Assessment Patient needs continued PT services  PT Problem List Decreased strength;Decreased knowledge of use of DME;Decreased activity tolerance;Decreased balance;Decreased safety awareness;Decreased mobility;Decreased knowledge of precautions       PT Treatment Interventions DME instruction;Therapeutic exercise;Gait training;Balance training;Neuromuscular re-education;Functional mobility training;Patient/family education;Therapeutic activities;Modalities;Manual techniques    PT Goals (Current goals can be found in the Care Plan section)  Acute Rehab PT Goals Patient Stated Goal: none stated PT Goal Formulation: Patient unable to participate in goal setting Time For Goal Achievement: 03/14/22 Potential to Achieve Goals: Fair    Frequency BID     Co-evaluation               AM-PAC PT "6 Clicks" Mobility  Outcome Measure Help needed turning from your back to your side while in a flat bed without using bedrails?: None Help needed moving from lying on your back to sitting on the side of a flat bed without using bedrails?: A Little Help needed moving to and from a bed to a chair (including a wheelchair)?: A Little  Help needed standing up from a chair using your arms (e.g., wheelchair or bedside chair)?: A Little Help needed to walk in hospital room?: A Lot Help needed climbing 3-5 steps with a railing? : Total 6 Click Score: 16    End of Session   Activity Tolerance: Patient tolerated treatment well Patient left: in chair;with chair alarm set;with call bell/phone within reach;with nursing/sitter in room Nurse Communication: Mobility status;Weight bearing status PT Visit Diagnosis: Unsteadiness on feet (R26.81);Other abnormalities of gait and mobility (R26.89);Difficulty in walking, not elsewhere classified (R26.2)    Time: 7619-5093 PT Time Calculation (min) (ACUTE ONLY): 18  min   Charges:   PT Evaluation $PT Eval Moderate Complexity: 1 Mod PT Treatments $Therapeutic Activity: 8-22 mins        Aleda Grana, PT, DPT 02/28/22, 9:14 AM   Sandi Mariscal 02/28/2022, 9:11 AM

## 2022-03-01 ENCOUNTER — Encounter: Payer: Self-pay | Admitting: Specialist

## 2022-03-01 DIAGNOSIS — S72001A Fracture of unspecified part of neck of right femur, initial encounter for closed fracture: Secondary | ICD-10-CM | POA: Diagnosis not present

## 2022-03-01 LAB — BASIC METABOLIC PANEL
Anion gap: 9 (ref 5–15)
BUN: 37 mg/dL — ABNORMAL HIGH (ref 8–23)
CO2: 22 mmol/L (ref 22–32)
Calcium: 8.1 mg/dL — ABNORMAL LOW (ref 8.9–10.3)
Chloride: 107 mmol/L (ref 98–111)
Creatinine, Ser: 1.35 mg/dL — ABNORMAL HIGH (ref 0.44–1.00)
GFR, Estimated: 38 mL/min — ABNORMAL LOW (ref >60)
Glucose, Bld: 233 mg/dL — ABNORMAL HIGH (ref 70–99)
Potassium: 4.1 mmol/L (ref 3.5–5.1)
Sodium: 138 mmol/L (ref 135–145)

## 2022-03-01 LAB — GLUCOSE, CAPILLARY
Glucose-Capillary: 174 mg/dL — ABNORMAL HIGH (ref 70–99)
Glucose-Capillary: 331 mg/dL — ABNORMAL HIGH (ref 70–99)
Glucose-Capillary: 218 mg/dL — ABNORMAL HIGH (ref 70–99)
Glucose-Capillary: 284 mg/dL — ABNORMAL HIGH (ref 70–99)

## 2022-03-01 LAB — CBC
HCT: 28.5 % — ABNORMAL LOW (ref 36.0–46.0)
Hemoglobin: 9.4 g/dL — ABNORMAL LOW (ref 12.0–15.0)
MCH: 29.4 pg (ref 26.0–34.0)
MCHC: 33 g/dL (ref 30.0–36.0)
MCV: 89.1 fL (ref 80.0–100.0)
Platelets: 231 10*3/uL (ref 150–400)
RBC: 3.2 MIL/uL — ABNORMAL LOW (ref 3.87–5.11)
RDW: 12.2 % (ref 11.5–15.5)
WBC: 8.9 10*3/uL (ref 4.0–10.5)
nRBC: 0 % (ref 0.0–0.2)

## 2022-03-01 MED ORDER — INSULIN ASPART 100 UNIT/ML IJ SOLN
0.0000 [IU] | Freq: Three times a day (TID) | INTRAMUSCULAR | Status: DC
  Administered 2022-03-01: 3 [IU] via SUBCUTANEOUS
  Administered 2022-03-01: 7 [IU] via SUBCUTANEOUS
  Administered 2022-03-02: 5 [IU] via SUBCUTANEOUS
  Administered 2022-03-02: 2 [IU] via SUBCUTANEOUS
  Administered 2022-03-02: 5 [IU] via SUBCUTANEOUS
  Administered 2022-03-03: 2 [IU] via SUBCUTANEOUS
  Administered 2022-03-03: 3 [IU] via SUBCUTANEOUS
  Filled 2022-03-01 (×7): qty 1

## 2022-03-01 NOTE — Inpatient Diabetes Management (Signed)
Inpatient Diabetes Program Recommendations  AACE/ADA: New Consensus Statement on Inpatient Glycemic Control   Target Ranges:  Prepandial:   less than 140 mg/dL      Peak postprandial:   less than 180 mg/dL (1-2 hours)      Critically ill patients:  140 - 180 mg/dL    Latest Reference Range & Units 02/28/22 08:28 02/28/22 11:43 02/28/22 15:45 02/28/22 20:19 03/01/22 07:53  Glucose-Capillary 70 - 99 mg/dL 222 (H) 284 (H) 239 (H) 281 (H) 174 (H)    Latest Reference Range & Units 02/26/22 17:40  Hemoglobin A1C 4.8 - 5.6 % 9.6 (H)   Review of Glycemic Control  Diabetes history: DM2 Outpatient Diabetes medications: Metformin 500 mg BID Current orders for Inpatient glycemic control: Levemir 8 units daily, Novolog 0-9 units TID with meals  Inpatient Diabetes Program Recommendations:    Insulin: Please consider ordering Novolog 3 units TID with meals for meal coverage if patient eats at least 50% of meals.  HbgA1C:  A1C 9.6% on 02/26/22 indicating average glucose of 229 mg/dl over the past 2-3 months.  Outpatient DM medications: At time of discharge, would recommend discharging patient on same insulin regimen as being used as inpatient.  NOTE: Patient is from SNF after fall with hip fracture. A1C 9.6% indicating an average glucose of 229 mg/dl and noted hyperglycemia as inpatient. Would recommend to discharge patient on same insulin regimen as being used while inpatient.  Thanks, Sonya Alderman, RN, MSN, [place] Diabetes Coordinator Inpatient Diabetes Program [phone] (Team Pager from 8am to [place])

## 2022-03-01 NOTE — Care Management Important Message (Signed)
Important Message  Patient Details  Name: Sonya Jones MRN: [id number] Date of Birth: [birth year]   Medicare Important Message Given:  N/A - LOS <3 / Initial given by admissions     Dannette Barbara 03/01/2022, 10:46 AM

## 2022-03-01 NOTE — Progress Notes (Signed)
PROGRESS NOTE    Sonya Jones  [id number] DOB: April 08, 1933 DOA: 02/26/2022 PCP: Francis Gaines, FNP   Brief Narrative: 88 year old with past medical history significant for diabetes type 2, vascular dementia, DVT on Eliquis presented to the ED from SNF with acute onset of accidental mechanical fall while she was bending over to pick up a book, subsequently fell on her right side and hit her right hip.  CT head negative for acute intracranial abnormality.  Chest x-ray no acute cardiopulmonary disease.  Right hip CT revealed acute mildly impacted subcapital right femoral neck fracture with normal femoral head alignment.  5.4 cm hematoma within the subcutaneous soft tissue of the lateral right hip.  Orthopedic was consulted and patient underwent percutaneous fixation of the right femoral neck with four 6.5 millimeter screw on 02/27/2022 by Dr. Sabra Heck.   Assessment & Plan:   Principal Problem:   Closed right hip fracture (HCC) Active Problems:   Uncontrolled type 2 diabetes mellitus with hyperglycemia, without long-term current use of insulin (HCC)   Essential hypertension   Dementia without behavioral disturbance (HCC)   History of DVT (deep vein thrombosis)   Pressure injury of skin  1-Closed Right Hip fracture: Presented after mechanical fall CT hip: acute mildly impacted subcapital right femoral neck fracture with normal femoral head alignment.  5.4 cm hematoma within the subcutaneous soft tissue of the lateral right hip. Eliquis has been on hold. Dr. Sabra Heck consulted and patient underwent percutaneous fixation of the right femoral neck with four 6.5 millimeter screw on 02/27/2022  DVT prophylaxis per otho. Back on Eliquis.  PT per ortho.  Bowel regimen.   2-Diabetes type 2 uncontrolled with hyperglycemia Continue with sliding scale insulin Hold metformin Started on Levemir.   Acute Blood loss anemia;  Post surgery expected and in the setting hip hematoma post fall.   Monitor hb. Hb on admission 12---down to 9 Hb remain stable today.   Recent diagnosed of DVT:  By doppler 02/17/2022: Extensive occlusive deep venous thrombosis in the right lower  extremity from the common femoral vein to the popliteal vein.  -ok to resume Eliquis per Ortho.  Monitor Hb.   Dementia Continue with Aricept and Namenda Avoid Haldol Delirium precaution Risperdal PRN  Hypertension; Continue with metoprolol  Nutrition Problem: Increased nutrient needs Etiology: hip fracture, post-op healing    Signs/Symptoms: estimated needs    Interventions: Ensure Enlive (each supplement provides 350kcal and 20 grams of protein), MVI  There is no height or weight on file to calculate BMI.   DVT prophylaxis: SCD Code Status: Full code Family Communication: Granddaughter over phone 10/22.  Status is: Inpatient Remains inpatient appropriate because: management hip fracture    Consultants:  Ortho  Procedures:    Antimicrobials:    Subjective: She is alert, denies pain. Pleasantly confuse. Tele-sitter.   Objective: Vitals:   02/28/22 0836 02/28/22 1437 02/28/22 2005 03/01/22 0735  BP: (!) 97/51 107/61 (!) 136/56 131/72  Pulse: 62 63 76 73  Resp: 16 17 20 16   Temp: 97.9 F (36.6 C) 97.9 F (36.6 C) 98.2 F (36.8 C) 97.9 F (36.6 C)  TempSrc: Oral  Oral   SpO2: 97% 99% 97% 97%   No intake or output data in the 24 hours ending 03/01/22 1442  There were no vitals filed for this visit.  Examination:  General exam: NAD Respiratory system: CTA Cardiovascular system: S 1, S 2 RRR Gastrointestinal system: BS present, soft, nt Central nervous system: alert, follows command Extremities: no edema,  right hip with dressing.   Data Reviewed: I have personally reviewed following labs and imaging studies  CBC: Recent Labs  Lab 02/26/22 1740 02/28/22 0604 03/01/22 0242  WBC 9.4 9.7 8.9  NEUTROABS 7.9*  --   --   HGB 12.9 9.1* 9.4*  HCT 39.4 26.9* 28.5*   MCV 90.8 89.1 89.1  PLT 221 195 [id number]    Basic Metabolic Panel: Recent Labs  Lab 02/26/22 1820 02/28/22 0604 03/01/22 0242  NA 137 139 138  K 3.6 3.9 4.1  CL 106 114* 107  CO2 20* 20* 22  GLUCOSE 343* 220* 233*  BUN 32* 26* 37*  CREATININE 1.26* 1.13* 1.35*  CALCIUM 9.2 7.9* 8.1*    GFR: CrCl cannot be calculated (Unknown ideal weight.). Liver Function Tests: No results for input(s): "AST", "ALT", "ALKPHOS", "BILITOT", "PROT", "ALBUMIN" in the last 168 hours. No results for input(s): "LIPASE", "AMYLASE" in the last 168 hours. No results for input(s): "AMMONIA" in the last 168 hours. Coagulation Profile: Recent Labs  Lab 02/26/22 1820  INR 1.9*    Cardiac Enzymes: No results for input(s): "CKTOTAL", "CKMB", "CKMBINDEX", "TROPONINI" in the last 168 hours. BNP (last 3 results) No results for input(s): "PROBNP" in the last 8760 hours. HbA1C: Recent Labs    02/26/22 1740  HGBA1C 9.6*    CBG: Recent Labs  Lab 02/28/22 1143 02/28/22 1545 02/28/22 2019 03/01/22 0753 03/01/22 1214  GLUCAP 284* 239* 281* 174* 331*    Lipid Profile: No results for input(s): "CHOL", "HDL", "LDLCALC", "TRIG", "CHOLHDL", "LDLDIRECT" in the last 72 hours. Thyroid Function Tests: No results for input(s): "TSH", "T4TOTAL", "FREET4", "T3FREE", "THYROIDAB" in the last 72 hours. Anemia Panel: No results for input(s): "VITAMINB12", "FOLATE", "FERRITIN", "TIBC", "IRON", "RETICCTPCT" in the last 72 hours. Sepsis Labs: No results for input(s): "PROCALCITON", "LATICACIDVEN" in the last 168 hours.  Recent Results (from the past 240 hour(s))  Resp Panel by RT-PCR (Flu A&B, Covid) Anterior Nasal Swab     Status: None   Collection Time: 02/26/22  4:11 PM   Specimen: Anterior Nasal Swab  Result Value Ref Range Status   SARS Coronavirus 2 by RT PCR NEGATIVE NEGATIVE Final    Comment: (NOTE) SARS-CoV-2 target nucleic acids are NOT DETECTED.  The SARS-CoV-2 RNA is generally detectable in upper  respiratory specimens during the acute phase of infection. The lowest concentration of SARS-CoV-2 viral copies this assay can detect is 138 copies/mL. A negative result does not preclude SARS-Cov-2 infection and should not be used as the sole basis for treatment or other patient management decisions. A negative result may occur with  improper specimen collection/handling, submission of specimen other than nasopharyngeal swab, presence of viral mutation(s) within the areas targeted by this assay, and inadequate number of viral copies(<138 copies/mL). A negative result must be combined with clinical observations, patient history, and epidemiological information. The expected result is Negative.  Fact Sheet for Patients:  EntrepreneurPulse.com.au  Fact Sheet for Healthcare Providers:  IncredibleEmployment.be  This test is no t yet approved or cleared by the [place] FDA and  has been authorized for detection and/or diagnosis of SARS-CoV-2 by FDA under an Emergency Use Authorization (EUA). This EUA will remain  in effect (meaning this test can be used) for the duration of the COVID-19 declaration under Section 564(b)(1) of the Act, 21 U.S.C.section 360bbb-3(b)(1), unless the authorization is terminated  or revoked sooner.       Influenza A by PCR NEGATIVE NEGATIVE Final   Influenza B by PCR  NEGATIVE NEGATIVE Final    Comment: (NOTE) The Xpert Xpress SARS-CoV-2/FLU/RSV plus assay is intended as an aid in the diagnosis of influenza from Nasopharyngeal swab specimens and should not be used as a sole basis for treatment. Nasal washings and aspirates are unacceptable for Xpert Xpress SARS-CoV-2/FLU/RSV testing.  Fact Sheet for Patients: EntrepreneurPulse.com.au  Fact Sheet for Healthcare Providers: IncredibleEmployment.be  This test is not yet approved or cleared by the [place] FDA and has been  authorized for detection and/or diagnosis of SARS-CoV-2 by FDA under an Emergency Use Authorization (EUA). This EUA will remain in effect (meaning this test can be used) for the duration of the COVID-19 declaration under Section 564(b)(1) of the Act, 21 U.S.C. section 360bbb-3(b)(1), unless the authorization is terminated or revoked.  Performed at [facility], [address] [place], [place] [address]   MRSA Next Gen by PCR, Nasal     Status: None   Collection Time: 02/27/22  6:42 AM   Specimen: Nasal Mucosa; Nasal Swab  Result Value Ref Range Status   MRSA by PCR Next Gen NOT DETECTED NOT DETECTED Final    Comment: (NOTE) The GeneXpert MRSA Assay (FDA approved for NASAL specimens only), is one component of a comprehensive MRSA colonization surveillance program. It is not intended to diagnose MRSA infection nor to guide or monitor treatment for MRSA infections. Test performance is not FDA approved in patients less than 21 years old. Performed at [facility], [address], [place], [place] [address]          Radiology Studies: No results found.      Scheduled Meds:  apixaban  5 mg Oral BID   donepezil  5 mg Oral QHS   feeding supplement  237 mL Oral BID BM   ferrous sulfate  325 mg Oral Q breakfast   insulin aspart  0-9 Units Subcutaneous TID WC   insulin detemir  8 Units Subcutaneous Daily   memantine  5 mg Oral BID   metoprolol tartrate  25 mg Oral BID   multivitamin with minerals  1 tablet Oral Daily   senna  1 tablet Oral BID   Continuous Infusions:     LOS: 3 days    Time spent: 35 minutes.     Elmarie Shiley, MD [facility]   If 7PM-7AM, please contact night-coverage [email or web address]  03/01/2022, 2:42 PM

## 2022-03-01 NOTE — Progress Notes (Signed)
Subjective: 2 Days Post-Op Procedure(s) (LRB): PERCUTANEOUS FIXATION OF FEMORAL NECK (Right)   Patient is confused but awake and alert.  She has been getting out of bed so she is being kept at the nursing station.  No complaints of pain.  Patient reports pain as none.  Objective:   VITALS:   Vitals:   02/28/22 2005 03/01/22 0735  BP: (!) 136/56 131/72  Pulse: 76 73  Resp: 20 16  Temp: 98.2 F (36.8 C) 97.9 F (36.6 C)  SpO2: 97% 97%    Neurologically intact Wounds are clean and dry and dressing is dry.  LABS Recent Labs    02/26/22 1740 02/28/22 0604 03/01/22 0242  HGB 12.9 9.1* 9.4*  HCT 39.4 26.9* 28.5*  WBC 9.4 9.7 8.9  PLT 221 195 231    Recent Labs    02/26/22 1820 02/28/22 0604 03/01/22 0242  NA 137 139 138  K 3.6 3.9 4.1  BUN 32* 26* 37*  CREATININE 1.26* 1.13* 1.35*  GLUCOSE 343* 220* 233*    Recent Labs    02/26/22 1820  INR 1.9*     Assessment/Plan: 2 Days Post-Op Procedure(s) (LRB): PERCUTANEOUS FIXATION OF FEMORAL NECK (Right)   Advance diet Up with therapy Discharge to SNF Continue Eliquis. Return to my office 2 weeks after discharge.

## 2022-03-01 NOTE — Progress Notes (Signed)
Physical Therapy Treatment Patient Details Name: Ruthella Kirchman MRN: [id number] DOB: [birth year] Today's Date: 03/01/2022   History of Present Illness Pt is an 88 year old female s/p percutaneous fixation of the right femoral neck with four 6.5 millimeter screw 02/27/22 after mechanical fall. PMH significant for diabetes type 2, vascular dementia, DVT on Eliquis    PT Comments    Pt seen for PT tx with pt received in bed, semi fowler position but asleep. Pt requires extra cuing/time to increase alertness - nurse reports pt received medication which is source of lethargy. Pt requires extra time, cuing, and assist to initiate supine>sit with pt able to complete with min assist. PT provided multiple cuing, various ways, & even attempted to hold R foot off of ground but pt continues to place R foot on floor during transfer & continues to pull up on RW with BUE vs pushing to standing. Pt completes bed>recliner with RW & min assist but weight bears through RLE so gait deferred at this time. Pt engages in RLE LAQ with cuing throughout for sustained attention to task. Will see pt again after lunch in hopes that pt is more alert & able to participate. Continue to recommend SNF upon d/c.    Recommendations for follow up therapy are one component of a multi-disciplinary discharge planning process, led by the attending physician.  Recommendations may be updated based on patient status, additional functional criteria and insurance authorization.  Follow Up Recommendations  Skilled nursing-short term rehab (<3 hours/day) Can patient physically be transported by private vehicle: Yes   Assistance Recommended at Discharge Frequent or constant Supervision/Assistance  Patient can return home with the following A lot of help with walking and/or transfers;A lot of help with bathing/dressing/bathroom;Assist for transportation   Equipment Recommendations  None recommended by PT    Recommendations for Other Services        Precautions / Restrictions Precautions Precautions: Fall Restrictions Weight Bearing Restrictions: Yes RLE Weight Bearing: Touchdown weight bearing     Mobility  Bed Mobility Overal bed mobility: Needs Assistance Bed Mobility: Supine to Sit     Supine to sit: HOB elevated, Min assist (extra time/cuing to initiate & complete 2/2 poor sustained attention to task)          Transfers Overall transfer level: Needs assistance Equipment used: Rolling walker (2 wheels) Transfers: Sit to/from Stand, Bed to chair/wheelchair/BSC Sit to Stand: Min assist, Mod assist (max multimodal cuing & PT assisting with hand placement during STS but pt continues to reposition BUE & pull to standing with BUE on RW)   Step pivot transfers: Min assist (Pt with poor ability to maintain TDWB despite PT even attempting to hold foot off of ground. Pt ultimately takes step pivot bed>recliner on R while weight bearing through RLE.)            Ambulation/Gait Ambulation/Gait assistance:  (deferred 2/2 lethargy & inability to maintain TDWB RLE)                 Stairs             Wheelchair Mobility    Modified Rankin (Stroke Patients Only)       Balance Overall balance assessment: Needs assistance Sitting-balance support: Feet supported, Bilateral upper extremity supported Sitting balance-Leahy Scale: Poor Sitting balance - Comments: Pt with several instances of posterior LOB while sitting EOB with cuing/assist to correct; pt able to sit with as little as supervision at times.   Standing balance support: Rel[place]  on assistive device for balance, Bilateral upper extremity supported Standing balance-Leahy Scale: Poor                              Cognition Arousal/Alertness: Lethargic, Suspect due to medications   Overall Cognitive Status: History of cognitive impairments - at baseline                   Orientation Level: Disoriented to, Place, Time,  Situation   Memory: Decreased recall of precautions, Decreased short-term memory Following Commands: Follows one step commands inconsistently, Follows one step commands with increased time Safety/Judgement: Decreased awareness of deficits, Decreased awareness of safety Awareness: Intellectual Problem Solving: Requires verbal cues, Requires tactile cues, Decreased initiation, Slow processing General Comments: Pt very lethargic today, reports she just "can't wake up"; nurse reports this is 2/2 medications. Pt requires encouragement & multimodal cuing to initiate mobility tasks throughout session. Poor ability to follow simple commands to maintain TDWB RLE throughout transfer.        Exercises General Exercises - Lower Extremity Long Arc Quad: AROM, Strengthening, Seated, 10 reps, Both    General Comments        Pertinent Vitals/Pain Pain Assessment Pain Assessment: Faces Faces Pain Scale: No hurt    Home Living                          Prior Function            PT Goals (current goals can now be found in the care plan section) Acute Rehab PT Goals Patient Stated Goal: none stated PT Goal Formulation: Patient unable to participate in goal setting Time For Goal Achievement: 03/14/22 Potential to Achieve Goals: Fair Progress towards PT goals: Progressing toward goals    Frequency    BID      PT Plan Current plan remains appropriate    Co-evaluation              AM-PAC PT "6 Clicks" Mobility   Outcome Measure  Help needed turning from your back to your side while in a flat bed without using bedrails?: None Help needed moving from lying on your back to sitting on the side of a flat bed without using bedrails?: A Little Help needed moving to and from a bed to a chair (including a wheelchair)?: A Little Help needed standing up from a chair using your arms (e.g., wheelchair or bedside chair)?: A Little Help needed to walk in hospital room?: A Lot Help  needed climbing 3-5 steps with a railing? : Total 6 Click Score: 16    End of Session   Activity Tolerance: Patient limited by lethargy Patient left: in chair;with chair alarm set;with call bell/phone within reach (telesitter in room) Nurse Communication: Mobility status (lethargy) PT Visit Diagnosis: Unsteadiness on feet (R26.81);Other abnormalities of gait and mobility (R26.89);Difficulty in walking, not elsewhere classified (R26.2)     Time: 6378-5885 PT Time Calculation (min) (ACUTE ONLY): 14 min  Charges:  $Therapeutic Activity: 8-22 mins                     Lavone Nian, PT, DPT 03/01/22, 10:03 AM   Waunita Schooner 03/01/2022, 10:00 AM

## 2022-03-01 NOTE — Progress Notes (Addendum)
Physical Therapy Treatment Patient Details Name: Sonya Jones MRN: [id number] DOB: [birth year] Today's Date: 03/01/2022   History of Present Illness Pt is an 88 year old female s/p percutaneous fixation of the right femoral neck with four 6.5 millimeter screw 02/27/22 after mechanical fall. PMH significant for diabetes type 2, vascular dementia, DVT on Eliquis    PT Comments    Pt seen for PT tx with pt received attempting to get out of recliner to bathroom. Pt requires max cuing for safety as pt only oriented to herself. Provided BSC & pt completes squat pivot chair<>BSC without AD with min assist & heavy cuing for sequencing; PT attempts to provide cuing/assistance to attempt to maintain weight bearing precautions RLE but pt does not. Pt with small continent BM after small incontinent BM. PT provides assistance for doffing soiled underwear & total assist for peri hygiene. Once in recliner pt performs RLE strengthening exercises with heavy cuing for technique. Donned chair alarm belt with pt return demonstrating how to use belt. Continued to educate pt on need for assistance to get up & left pt with call bell in lap, but suspect pt will forget 2/2 hx of cognitive deficits; telesitter in room.    Recommendations for follow up therapy are one component of a multi-disciplinary discharge planning process, led by the attending physician.  Recommendations may be updated based on patient status, additional functional criteria and insurance authorization.  Follow Up Recommendations  Skilled nursing-short term rehab (<3 hours/day) Can patient physically be transported by private vehicle: Yes   Assistance Recommended at Discharge Frequent or constant Supervision/Assistance  Patient can return home with the following A lot of help with walking and/or transfers;A lot of help with bathing/dressing/bathroom;Assist for transportation   Equipment Recommendations  None recommended by PT    Recommendations  for Other Services       Precautions / Restrictions Precautions Precautions: Fall Restrictions Weight Bearing Restrictions: Yes RLE Weight Bearing: Touchdown weight bearing     Mobility  Bed Mobility            General bed mobility comments: not tested, pt received & left in recliner    Transfers Overall transfer level: Needs assistance Transfers: Bed to chair/wheelchair/BSC     Squat pivot transfers: Min assist     General transfer comment: squat pivot bed<>BSC without AD with min assist, max cuing for sequencing & technique, assistance to attempt to minimize weight bearing RLE    Ambulation/Gait                  Stairs             Wheelchair Mobility    Modified Rankin (Stroke Patients Only)       Balance Overall balance assessment: Needs assistance Sitting-balance support: Feet supported, Bilateral upper extremity supported Sitting balance-Leahy Scale: Fair                                   Cognition Arousal/Alertness: Awake/alert Behavior During Therapy: Impulsive Overall Cognitive Status: History of cognitive impairments - at baseline Area of Impairment: Orientation, Memory, Following commands, Safety/judgement, Awareness, Problem solving                 Orientation Level: Disoriented to, Place, Time, Situation   Memory: Decreased recall of precautions, Decreased short-term memory Following Commands: Follows one step commands inconsistently, Follows one step commands with increased time Safety/Judgement: Decreased awareness of deficits,  Decreased awareness of safety Awareness: Intellectual Problem Solving: Requires verbal cues, Requires tactile cues, Decreased initiation, Slow processing General Comments: Poor safety awareness, pt attempting to get out of recliner with footrest still up. Pt requires ongoing cuing for safety & precautions as pt unable to maintain them during session.        Exercises  General Exercises - Lower Extremity Heel Slides: AAROM, Strengthening, Right, 10 reps Hip ABduction/ADduction: AAROM, Strengthening, Right, 10 reps Straight Leg Raises: AAROM, Strengthening, Right, 10 reps    General Comments        Pertinent Vitals/Pain Pain Assessment Pain Assessment: Faces Faces Pain Scale: No hurt    Home Living                          Prior Function            PT Goals (current goals can now be found in the care plan section) Acute Rehab PT Goals Patient Stated Goal: none stated PT Goal Formulation: Patient unable to participate in goal setting Time For Goal Achievement: 03/14/22 Potential to Achieve Goals: Fair Progress towards PT goals: Progressing toward goals    Frequency    BID      PT Plan Current plan remains appropriate    Co-evaluation              AM-PAC PT "6 Clicks" Mobility   Outcome Measure  Help needed turning from your back to your side while in a flat bed without using bedrails?: None Help needed moving from lying on your back to sitting on the side of a flat bed without using bedrails?: A Little Help needed moving to and from a bed to a chair (including a wheelchair)?: A Little Help needed standing up from a chair using your arms (e.g., wheelchair or bedside chair)?: A Little Help needed to walk in hospital room?: A Lot Help needed climbing 3-5 steps with a railing? : Total 6 Click Score: 16    End of Session   Activity Tolerance: Patient tolerated treatment well Patient left: with chair alarm set;in chair;with call bell/phone within reach (telesitter in room) Nurse Communication: Mobility status PT Visit Diagnosis: Unsteadiness on feet (R26.81);Other abnormalities of gait and mobility (R26.89);Difficulty in walking, not elsewhere classified (R26.2)     Time: [id number] PT Time Calculation (min) (ACUTE ONLY): 15 min  Charges:  $Therapeutic Activity: 8-22 mins                     Lavone Nian,  PT, DPT 03/01/22, 11:34 AM  Waunita Schooner 03/01/2022, 11:31 AM

## 2022-03-01 NOTE — Progress Notes (Signed)
Grand daughter, Neoma Laming called to check on pt. This nurse provided an update, by returning call to granddaughter at [phone]. Informed her pt has a Oncologist and unable to return to the Facility at this time. Per conversation with Education officer, museum, MD made aware to determine to try 24 hours free without sitter for pt to go back to [facility].

## 2022-03-02 DIAGNOSIS — S72001A Fracture of unspecified part of neck of right femur, initial encounter for closed fracture: Secondary | ICD-10-CM | POA: Diagnosis not present

## 2022-03-02 LAB — CBC
HCT: 29.7 % — ABNORMAL LOW (ref 36.0–46.0)
Hemoglobin: 9.8 g/dL — ABNORMAL LOW (ref 12.0–15.0)
MCH: 29.2 pg (ref 26.0–34.0)
MCHC: 33 g/dL (ref 30.0–36.0)
MCV: 88.4 fL (ref 80.0–100.0)
Platelets: 275 10*3/uL (ref 150–400)
RBC: 3.36 MIL/uL — ABNORMAL LOW (ref 3.87–5.11)
RDW: 12.2 % (ref 11.5–15.5)
WBC: 7 10*3/uL (ref 4.0–10.5)
nRBC: 0 % (ref 0.0–0.2)

## 2022-03-02 LAB — BASIC METABOLIC PANEL
Anion gap: 4 — ABNORMAL LOW (ref 5–15)
BUN: 26 mg/dL — ABNORMAL HIGH (ref 8–23)
CO2: 22 mmol/L (ref 22–32)
Calcium: 8 mg/dL — ABNORMAL LOW (ref 8.9–10.3)
Chloride: 109 mmol/L (ref 98–111)
Creatinine, Ser: 1.07 mg/dL — ABNORMAL HIGH (ref 0.44–1.00)
GFR, Estimated: 50 mL/min — ABNORMAL LOW (ref >60)
Glucose, Bld: 192 mg/dL — ABNORMAL HIGH (ref 70–99)
Potassium: 4 mmol/L (ref 3.5–5.1)
Sodium: 135 mmol/L (ref 135–145)

## 2022-03-02 LAB — GLUCOSE, CAPILLARY
Glucose-Capillary: 180 mg/dL — ABNORMAL HIGH (ref 70–99)
Glucose-Capillary: 296 mg/dL — ABNORMAL HIGH (ref 70–99)
Glucose-Capillary: 261 mg/dL — ABNORMAL HIGH (ref 70–99)
Glucose-Capillary: 330 mg/dL — ABNORMAL HIGH (ref 70–99)

## 2022-03-02 MED ORDER — INSULIN ASPART 100 UNIT/ML IJ SOLN: 0.0000 [IU] | Freq: Three times a day (TID) | INTRAMUSCULAR | Status: DC

## 2022-03-02 MED ORDER — INSULIN DETEMIR 100 UNIT/ML ~~LOC~~ SOLN
10.0000 [IU] | Freq: Every day | SUBCUTANEOUS | Status: DC
  Administered 2022-03-02 – 2022-03-03 (×2): 10 [IU] via SUBCUTANEOUS
  Filled 2022-03-02 (×2): qty 0.1

## 2022-03-02 MED ORDER — INSULIN ASPART 100 UNIT/ML IJ SOLN
3.0000 [IU] | Freq: Three times a day (TID) | INTRAMUSCULAR | Status: DC
  Administered 2022-03-03 (×2): 3 [IU] via SUBCUTANEOUS
  Filled 2022-03-02 (×2): qty 1

## 2022-03-02 MED ORDER — GLUCERNA SHAKE PO LIQD
237.0000 mL | Freq: Three times a day (TID) | ORAL | Status: DC
  Administered 2022-03-02 – 2022-03-03 (×4): 237 mL via ORAL

## 2022-03-02 MED ORDER — INSULIN ASPART 100 UNIT/ML IJ SOLN
0.0000 [IU] | Freq: Every day | INTRAMUSCULAR | Status: DC
  Administered 2022-03-02: 4 [IU] via SUBCUTANEOUS
  Filled 2022-03-02: qty 1

## 2022-03-02 NOTE — Progress Notes (Signed)
Nutrition Follow-up  DOCUMENTATION CODES:   Not applicable  INTERVENTION:   -D/c Ensure Enlive po BID, each supplement provides 350 kcal and 20 grams of protein -Glucerna Shake po TID, each supplement provides 220 kcal and 10 grams of protein  -Continue MVI with minerals daily -Obtain new wt  NUTRITION DIAGNOSIS:   Increased nutrient needs related to hip fracture, post-op healing as evidenced by estimated needs.  Ongoing  GOAL:   Patient will meet greater than or equal to 90% of their needs  Progressing   MONITOR:   PO intake, Supplement acceptance, Weight trends, I & O's, Labs  REASON FOR ASSESSMENT:   Consult Hip fracture protocol  ASSESSMENT:   88 y.o. female with medical history significant for type 2 diabetes mellitus, vascular dementia, DVT on Eliquis who presented to the emergency room from her SNF with acute onset of accidental mechanical fall while she was bending over to pick up a book with subsequent fall on her right side and right hip pain.  X-ray was obtained and SNF revealing a fracture.  10/21- s/p PERCUTANEOUS FIXATION OF RIGHT FEMORAL NECK with four 6.5 millimeters screws   Reviewed I/O's: +822 ml x 24 hours   Spoke with pt, who was sitting in recliner chair at nursing station. Pt pleasant and in good spirits and states to this RD "I'm grateful that I'm here today".   Pt shares that she has a good appetite and has been eating her meals. Noted pt consume a banana without difficulty. She reports she has a good appetite PTA, but unable to provide diet recall.   No wt hx available to assess at this time. Noted Wt of 137# per [facility] encounter on 09/01/20. RD will order new wt to better assess wt trends.   Per MD notes, plan to discharge to SNF once medically stable.   Medications reviewed and include ferrous sulfate and senokot.    Labs reviewed: CBGS: [id number] (inpatient orders for glycemic control are 0-9 units insulin aspart TID with meals, 3  units insulin aspart TID with meals, and 10 units insulin detemir daily).    NUTRITION - FOCUSED PHYSICAL EXAM:  Flowsheet Row Most Recent Value  Orbital Region No depletion  Upper Arm Region Mild depletion  Thoracic and Lumbar Region No depletion  Buccal Region No depletion  Temple Region No depletion  Clavicle Bone Region No depletion  Clavicle and Acromion Bone Region No depletion  Scapular Bone Region No depletion  Dorsal Hand No depletion  Patellar Region No depletion  Anterior Thigh Region No depletion  Posterior Calf Region No depletion  Edema (RD Assessment) Mild  Hair Reviewed  Eyes Reviewed  Mouth Reviewed  Skin Reviewed  Nails Reviewed       Diet Order:   Diet Order             Diet Carb Modified Fluid consistency: Thin; Room service appropriate? Yes  Diet effective now                   EDUCATION NEEDS:   Not appropriate for education at this time  Skin:  Skin Assessment: Skin Integrity Issues: Skin Integrity Issues:: Stage I, Incisions Stage I: coccyx Incisions: closed rt hip  Last BM:  03/02/22 (type 6)  Height:   Ht Readings from Last 1 Encounters:  03/02/22 5\' 4"  (1.626 m)    Weight:   Wt Readings from Last 1 Encounters:  No data found for Wt    Ideal Body Weight:  54.5 kg  BMI:  There is no height or weight on file to calculate BMI.  Estimated Nutritional Needs:   Kcal:  1600-1800  Protein:  85-100 grams  Fluid:  > 1.6 L    Loistine Chance, RD, LDN, [place] Registered Dietitian II Certified Diabetes Care and Education Specialist Please refer to [facility] for RD and/or RD on-call/weekend/after hours pager

## 2022-03-02 NOTE — Progress Notes (Signed)
Occupational Therapy Treatment Patient Details Name: Sonya Jones MRN: [id number] DOB: 08/29/1932 Today's Date: 03/02/2022   History of present illness Pt is an 88 year old female s/p percutaneous fixation of the right femoral neck with four 6.5 millimeter screw 02/27/22 after mechanical fall. PMH significant for diabetes type 2, vascular dementia, DVT on Eliquis   OT comments  Pt received seated in recliner in hallway with PT. Appearing alert, conversational, pleasant; willing to work with OT on ADLs. See flowsheet below for further details of session. Left in recliner at RN station with all needs in reach.     Recommendations for follow up therapy are one component of a multi-disciplinary discharge planning process, led by the attending physician.  Recommendations may be updated based on patient status, additional functional criteria and insurance authorization.    Follow Up Recommendations  Skilled nursing-short term rehab (<3 hours/day)    Assistance Recommended at Discharge Frequent or constant Supervision/Assistance  Patient can return home with the following  A lot of help with walking and/or transfers;A little help with bathing/dressing/bathroom;Direct supervision/assist for medications management;Other (comment) (dependent for finiancial management, housekeeping, and all other IADLs)   Equipment Recommendations  Other (comment) (defer to next venue of care)    Recommendations for Other Services      Precautions / Restrictions Precautions Precautions: Fall Restrictions Weight Bearing Restrictions: Yes RLE Weight Bearing: Touchdown weight bearing       Mobility Bed Mobility                    Transfers Overall transfer level: Needs assistance Equipment used: Rolling walker (2 wheels) Transfers: Sit to/from Stand Sit to Stand: Min guard (not maintaining TDWB RLE precautions)                 Balance                                            ADL either performed or assessed with clinical judgement   ADL Overall ADL's : Needs assistance/impaired     Grooming: Set up;Supervision/safety;Wash/dry face;Oral care Grooming Details (indicate cue type and reason): Pt becoming distracted by washcloth in her lap; needed redirection to task of brushing teeth. Brushed teeth and washed face sitting in recliner facing the sink. Pt able to reach for items on countertop. Able to follow instructions for using water to rinse and using empty cup to spit after rinsing.                             Functional mobility during ADLs:  (Stood once with RW, but not able to maintain TDWB in standing for more than about 10 seconds.) General ADL Comments: Following cues well today while seated for ADLs.    Extremity/Trunk Assessment Upper Extremity Assessment Upper Extremity Assessment: Overall WFL for tasks assessed   Lower Extremity Assessment Lower Extremity Assessment: Defer to PT evaluation        Vision       Perception     Praxis      Cognition Arousal/Alertness: Awake/alert Behavior During Therapy: WFL for tasks assessed/performed Overall Cognitive Status: History of cognitive impairments - at baseline Area of Impairment: Orientation, Memory, Safety/judgement, Awareness                 Orientation Level: Disoriented to, Place, Time (  Pt is aware that she broke her hip; aware that she is 71 (she is actually 59); aware of person; aware of birthday.)   Memory: Decreased recall of precautions, Decreased short-term memory Following Commands: Follows one step commands inconsistently, Follows one step commands with increased time Safety/Judgement: Decreased awareness of deficits, Decreased awareness of safety   Problem Solving: Requires verbal cues, Requires tactile cues, Decreased initiation, Slow processing General Comments: Pt is calm in recliner, working with PT in hallway when OT arrived.         Exercises      Shoulder Instructions       General Comments      Pertinent Vitals/ Pain       Pain Assessment Pain Assessment: No/denies pain  Home Living                                          Prior Functioning/Environment              Frequency  Min 2X/week        Progress Toward Goals  OT Goals(current goals can now be found in the care plan section)  Progress towards OT goals: Progressing toward goals  Acute Rehab OT Goals Patient Stated Goal: go home OT Goal Formulation: With patient Time For Goal Achievement: 03/14/22 Potential to Achieve Goals: Good ADL Goals Pt Will Perform Lower Body Dressing: with supervision;sit to/from stand Pt Will Transfer to Toilet: with supervision Pt Will Perform Toileting - Clothing Manipulation and hygiene: with supervision;sit to/from stand  Plan Discharge plan remains appropriate    Co-evaluation                 AM-PAC OT "6 Clicks" Daily Activity     Outcome Measure   Help from another person eating meals?: None Help from another person taking care of personal grooming?: A Little (set up and cues, from seated only) Help from another person toileting, which includes using toliet, bedpan, or urinal?: A Little Help from another person bathing (including washing, rinsing, drying)?: A Little Help from another person to put on and taking off regular upper body clothing?: None Help from another person to put on and taking off regular lower body clothing?: A Lot 6 Click Score: 19    End of Session Equipment Utilized During Treatment: Rolling walker (2 wheels)  OT Visit Diagnosis: History of falling (Z91.81);Unsteadiness on feet (R26.81)   Activity Tolerance Patient tolerated treatment well   Patient Left in chair (at RN station)   Nurse Communication  Pt at RN station; where she has been earlier in the day. Pt enjoying looking through magazines and Bible.        Time: [id number] OT  Time Calculation (min): 14 min  Charges: OT General Charges $OT Visit: 1 Visit OT Treatments $Self Care/Home Management : 8-22 mins  Sonya Foster, MS, OTR/L   Sonya Jones 03/02/2022, 3:54 PM

## 2022-03-02 NOTE — Plan of Care (Signed)
Problem: Education: Goal: Knowledge of General Education information will improve Description: Including pain rating scale, medication(s)/side effects and non-pharmacologic comfort measures 03/02/2022 0401 by Balinda Quails, LPN Outcome: Not Progressing 03/02/2022 0400 by Balinda Quails, LPN Outcome: Progressing   Problem: Health Behavior/Discharge Planning: Goal: Ability to manage health-related needs will improve 03/02/2022 0401 by Balinda Quails, LPN Outcome: Not Progressing 03/02/2022 0400 by Balinda Quails, LPN Outcome: Progressing   Problem: Safety: Goal: Ability to remain free from injury will improve 03/02/2022 0401 by Balinda Quails, LPN Outcome: Not Progressing 03/02/2022 0400 by Balinda Quails, LPN Outcome: Progressing   Problem: Clinical Measurements: Goal: Ability to maintain clinical measurements within normal limits will improve 03/02/2022 0401 by Balinda Quails, LPN Outcome: Progressing 03/02/2022 0400 by Balinda Quails, LPN Outcome: Progressing Goal: Will remain free from infection 03/02/2022 0401 by Balinda Quails, LPN Outcome: Progressing 03/02/2022 0400 by Balinda Quails, LPN Outcome: Progressing Goal: Diagnostic test results will improve 03/02/2022 0401 by Balinda Quails, LPN Outcome: Progressing 03/02/2022 0400 by Balinda Quails, LPN Outcome: Progressing Goal: Respiratory complications will improve Outcome: Progressing Goal: Cardiovascular complication will be avoided Outcome: Progressing   Problem: Activity: Goal: Risk for activity intolerance will decrease 03/02/2022 0401 by Balinda Quails, LPN Outcome: Progressing 03/02/2022 0400 by Balinda Quails, LPN Outcome: Progressing   Problem: Nutrition: Goal: Adequate nutrition will be maintained 03/02/2022 0401 by Balinda Quails, LPN Outcome: Progressing 03/02/2022 0400 by Balinda Quails, LPN Outcome: Progressing   Problem: Coping: Goal: Level of anxiety will  decrease 03/02/2022 0401 by Balinda Quails, LPN Outcome: Progressing 03/02/2022 0400 by Balinda Quails, LPN Outcome: Progressing   Problem: Elimination: Goal: Will not experience complications related to bowel motility Outcome: Progressing Goal: Will not experience complications related to urinary retention Outcome: Progressing   Problem: Pain Managment: Goal: General experience of comfort will improve 03/02/2022 0401 by Balinda Quails, LPN Outcome: Progressing 03/02/2022 0400 by Balinda Quails, LPN Outcome: Progressing   Problem: Skin Integrity: Goal: Risk for impaired skin integrity will decrease 03/02/2022 0401 by Balinda Quails, LPN Outcome: Progressing 03/02/2022 0400 by Balinda Quails, LPN Outcome: Progressing   Problem: Education: Goal: Ability to describe self-care measures that may prevent or decrease complications (Diabetes Survival Skills Education) will improve Outcome: Progressing Goal: Individualized Educational Video(s) Outcome: Progressing   Problem: Coping: Goal: Ability to adjust to condition or change in health will improve Outcome: Progressing   Problem: Fluid Volume: Goal: Ability to maintain a balanced intake and output will improve Outcome: Progressing   Problem: Health Behavior/Discharge Planning: Goal: Ability to identify and utilize available resources and services will improve Outcome: Progressing Goal: Ability to manage health-related needs will improve Outcome: Progressing   Problem: Metabolic: Goal: Ability to maintain appropriate glucose levels will improve Outcome: Progressing   Problem: Nutritional: Goal: Maintenance of adequate nutrition will improve Outcome: Progressing Goal: Progress toward achieving an optimal weight will improve Outcome: Progressing   Problem: Skin Integrity: Goal: Risk for impaired skin integrity will decrease Outcome: Progressing   Problem: Tissue Perfusion: Goal: Adequacy of tissue perfusion  will improve Outcome: Progressing   Problem: Education: Goal: Verbalization of understanding the information provided (i.e., activity precautions, restrictions, etc) will improve Outcome: Progressing Goal: Individualized Educational Video(s) Outcome: Progressing   Problem: Activity: Goal: Ability to ambulate and perform ADLs will improve Outcome: Progressing   Problem: Clinical Measurements: Goal: Postoperative complications will be avoided or minimized Outcome: Progressing   Problem: Self-Concept:  Goal: Ability to maintain and perform role responsibilities to the fullest extent possible will improve Outcome: Progressing   Problem: Pain Management: Goal: Pain level will decrease Outcome: Progressing

## 2022-03-02 NOTE — Progress Notes (Signed)
PROGRESS NOTE    Sonya Jones  SUP:[id number] DOB: October 18, 1932 DOA: 02/26/2022 PCP: Francis Gaines, FNP   Brief Narrative: 88 year old with past medical history significant for diabetes type 2, vascular dementia, DVT on Eliquis presented to the ED from SNF with acute onset of accidental mechanical fall while she was bending over to pick up a book, subsequently fell on her right side and hit her right hip.  CT head negative for acute intracranial abnormality.  Chest x-ray no acute cardiopulmonary disease.  Right hip CT revealed acute mildly impacted subcapital right femoral neck fracture with normal femoral head alignment.  5.4 cm hematoma within the subcutaneous soft tissue of the lateral right hip.  Orthopedic was consulted and patient underwent percutaneous fixation of the right femoral neck with four 6.5 millimeter screw on 02/27/2022 by Dr. Sabra Heck.   Assessment & Plan:   Principal Problem:   Closed right hip fracture (HCC) Active Problems:   Uncontrolled type 2 diabetes mellitus with hyperglycemia, without long-term current use of insulin (HCC)   Essential hypertension   Dementia without behavioral disturbance (HCC)   History of DVT (deep vein thrombosis)   Pressure injury of skin  1-Closed Right Hip fracture: Presented after mechanical fall CT hip: acute mildly impacted subcapital right femoral neck fracture with normal femoral head alignment.  5.4 cm hematoma within the subcutaneous soft tissue of the lateral right hip. Eliquis has been on hold. Dr. Sabra Heck consulted and patient underwent percutaneous fixation of the right femoral neck with four 6.5 millimeter screw on 02/27/2022  DVT prophylaxis per otho. Back on Eliquis.  PT per ortho.  Bowel regimen.   2-Diabetes type 2 uncontrolled with hyperglycemia Continue with sliding scale insulin Hold metformin Started on Levemir. She will need to be discharge on insulin.   Urine retention;  Had in and out yielding 1 L of urine.   Bladder scan ordered.  If reoccurs will need foley catheter placement.   Acute Blood loss anemia;  Post surgery expected and in the setting hip hematoma post fall.  Monitor hb. Hb on admission 12---down to 9 Hb stable.   Recent diagnosed of DVT:  By doppler 02/17/2022: Extensive occlusive deep venous thrombosis in the right lower  extremity from the common femoral vein to the popliteal vein.  -Ok to resume Eliquis per Ortho.  -Monitor Hb.   Dementia Continue with Aricept and Namenda Avoid Haldol Delirium precaution Risperdal PRN She required sitter last night. Without sitter this am.   Hypertension; Continue with metoprolol  Nutrition Problem: Increased nutrient needs Etiology: hip fracture, post-op healing    Signs/Symptoms: estimated needs    Interventions: Ensure Enlive (each supplement provides 350kcal and 20 grams of protein), MVI  Estimated body mass index is 25.24 kg/m as calculated from the following:   Height as of this encounter: 5\' 4"  (1.626 m).   Weight as of this encounter: 66.7 kg.   DVT prophylaxis: SCD Code Status: Full code Family Communication: Granddaughter over phone 10/22.  Status is: Inpatient Remains inpatient appropriate because: management hip fracture    Consultants:  Ortho  Procedures:    Antimicrobials:    Subjective: She is alert, abdomen distended. Had BM, Bladder scan showed 999, in and out done.   Objective: Vitals:   03/02/22 0800 03/02/22 0921 03/02/22 1200 03/02/22 1459  BP: (!) 143/65   129/80  Pulse: 71   72  Resp: 16   17  Temp: 98 F (36.7 C)   98.1 F (36.7 C)  TempSrc: Oral  SpO2: 99%   100%  Weight:   66.7 kg   Height:  5\' 4"  (1.626 m)      Intake/Output Summary (Last 24 hours) at 03/02/2022 1714 Last data filed at 03/02/2022 1300 Gross per 24 hour  Intake --  Output 1500 ml  Net -1500 ml    Filed Weights   03/02/22 1200  Weight: 66.7 kg    Examination:  General exam:  NAD Respiratory system: CTA Cardiovascular system: S 1, S 2 RRR Gastrointestinal system:  BS present, soft, nt Central nervous system:Alert, pleasantly confuse.  Extremities: no edema, right hip with dressing.   Data Reviewed: I have personally reviewed following labs and imaging studies  CBC: Recent Labs  Lab 02/26/22 1740 02/28/22 0604 03/01/22 0242 03/02/22 0554  WBC 9.4 9.7 8.9 7.0  NEUTROABS 7.9*  --   --   --   HGB 12.9 9.1* 9.4* 9.8*  HCT 39.4 26.9* 28.5* 29.7*  MCV 90.8 89.1 89.1 88.4  PLT 221 195 231 275    Basic Metabolic Panel: Recent Labs  Lab 02/26/22 1820 02/28/22 0604 03/01/22 0242 03/02/22 0554  NA 137 139 138 135  K 3.6 3.9 4.1 4.0  CL 106 114* 107 109  CO2 20* 20* 22 22  GLUCOSE 343* 220* 233* 192*  BUN 32* 26* 37* 26*  CREATININE 1.26* 1.13* 1.35* 1.07*  CALCIUM 9.2 7.9* 8.1* 8.0*    GFR: Estimated Creatinine Clearance: 34.8 mL/min (A) (by C-G formula based on SCr of 1.07 mg/dL (H)). Liver Function Tests: No results for input(s): "AST", "ALT", "ALKPHOS", "BILITOT", "PROT", "ALBUMIN" in the last 168 hours. No results for input(s): "LIPASE", "AMYLASE" in the last 168 hours. No results for input(s): "AMMONIA" in the last 168 hours. Coagulation Profile: Recent Labs  Lab 02/26/22 1820  INR 1.9*    Cardiac Enzymes: No results for input(s): "CKTOTAL", "CKMB", "CKMBINDEX", "TROPONINI" in the last 168 hours. BNP (last 3 results) No results for input(s): "PROBNP" in the last 8760 hours. HbA1C: No results for input(s): "HGBA1C" in the last 72 hours.  CBG: Recent Labs  Lab 03/01/22 1714 03/01/22 2124 03/02/22 0808 03/02/22 1216 03/02/22 1654  GLUCAP 218* 284* 180* 296* 261*    Lipid Profile: No results for input(s): "CHOL", "HDL", "LDLCALC", "TRIG", "CHOLHDL", "LDLDIRECT" in the last 72 hours. Thyroid Function Tests: No results for input(s): "TSH", "T4TOTAL", "FREET4", "T3FREE", "THYROIDAB" in the last 72 hours. Anemia Panel: No  results for input(s): "VITAMINB12", "FOLATE", "FERRITIN", "TIBC", "IRON", "RETICCTPCT" in the last 72 hours. Sepsis Labs: No results for input(s): "PROCALCITON", "LATICACIDVEN" in the last 168 hours.  Recent Results (from the past 240 hour(s))  Resp Panel by RT-PCR (Flu A&B, Covid) Anterior Nasal Swab     Status: None   Collection Time: 02/26/22  4:11 PM   Specimen: Anterior Nasal Swab  Result Value Ref Range Status   SARS Coronavirus 2 by RT PCR NEGATIVE NEGATIVE Final    Comment: (NOTE) SARS-CoV-2 target nucleic acids are NOT DETECTED.  The SARS-CoV-2 RNA is generally detectable in upper respiratory specimens during the acute phase of infection. The lowest concentration of SARS-CoV-2 viral copies this assay can detect is 138 copies/mL. A negative result does not preclude SARS-Cov-2 infection and should not be used as the sole basis for treatment or other patient management decisions. A negative result may occur with  improper specimen collection/handling, submission of specimen other than nasopharyngeal swab, presence of viral mutation(s) within the areas targeted by this assay, and inadequate number of viral  copies(<138 copies/mL). A negative result must be combined with clinical observations, patient history, and epidemiological information. The expected result is Negative.  Fact Sheet for Patients:  EntrepreneurPulse.com.au  Fact Sheet for Healthcare Providers:  IncredibleEmployment.be  This test is no t yet approved or cleared by the [place] FDA and  has been authorized for detection and/or diagnosis of SARS-CoV-2 by FDA under an Emergency Use Authorization (EUA). This EUA will remain  in effect (meaning this test can be used) for the duration of the COVID-19 declaration under Section 564(b)(1) of the Act, 21 U.S.C.section 360bbb-3(b)(1), unless the authorization is terminated  or revoked sooner.       Influenza A by PCR  NEGATIVE NEGATIVE Final   Influenza B by PCR NEGATIVE NEGATIVE Final    Comment: (NOTE) The Xpert Xpress SARS-CoV-2/FLU/RSV plus assay is intended as an aid in the diagnosis of influenza from Nasopharyngeal swab specimens and should not be used as a sole basis for treatment. Nasal washings and aspirates are unacceptable for Xpert Xpress SARS-CoV-2/FLU/RSV testing.  Fact Sheet for Patients: EntrepreneurPulse.com.au  Fact Sheet for Healthcare Providers: IncredibleEmployment.be  This test is not yet approved or cleared by the [place] FDA and has been authorized for detection and/or diagnosis of SARS-CoV-2 by FDA under an Emergency Use Authorization (EUA). This EUA will remain in effect (meaning this test can be used) for the duration of the COVID-19 declaration under Section 564(b)(1) of the Act, 21 U.S.C. section 360bbb-3(b)(1), unless the authorization is terminated or revoked.  Performed at [facility], [address] [place], [place] [address]   MRSA Next Gen by PCR, Nasal     Status: None   Collection Time: 02/27/22  6:42 AM   Specimen: Nasal Mucosa; Nasal Swab  Result Value Ref Range Status   MRSA by PCR Next Gen NOT DETECTED NOT DETECTED Final    Comment: (NOTE) The GeneXpert MRSA Assay (FDA approved for NASAL specimens only), is one component of a comprehensive MRSA colonization surveillance program. It is not intended to diagnose MRSA infection nor to guide or monitor treatment for MRSA infections. Test performance is not FDA approved in patients less than 19 years old. Performed at [facility], [address], [place], [place] [address]          Radiology Studies: No results found.      Scheduled Meds:  apixaban  5 mg Oral BID   donepezil  5 mg Oral QHS   feeding supplement (GLUCERNA SHAKE)  237 mL Oral TID BM   ferrous sulfate  325 mg Oral Q breakfast   insulin aspart  0-9 Units  Subcutaneous TID WC   insulin aspart  3 Units Subcutaneous TID WC   insulin detemir  10 Units Subcutaneous Daily   memantine  5 mg Oral BID   metoprolol tartrate  25 mg Oral BID   multivitamin with minerals  1 tablet Oral Daily   senna  1 tablet Oral BID   Continuous Infusions:     LOS: 4 days    Time spent: 35 minutes.     Elmarie Shiley, MD [facility]   If 7PM-7AM, please contact night-coverage [email or web address]  03/02/2022, 5:14 PM

## 2022-03-02 NOTE — Inpatient Diabetes Management (Signed)
Inpatient Diabetes Program Recommendations  AACE/ADA: New Consensus Statement on Inpatient Glycemic Control   Target Ranges:  Prepandial:   less than 140 mg/dL      Peak postprandial:   less than 180 mg/dL (1-2 hours)      Critically ill patients:  140 - 180 mg/dL    Latest Reference Range & Units 03/01/22 07:53 03/01/22 12:14 03/01/22 17:14 03/01/22 21:24 03/02/22 08:08  Glucose-Capillary 70 - 99 mg/dL 174 (H) 331 (H) 218 (H) 284 (H) 180 (H)    Review of Glycemic Control  Diabetes history: DM2 Outpatient Diabetes medications: Metformin 500 mg BID Current orders for Inpatient glycemic control: Levemir 8 units daily, Novolog 0-9 units TID with meals   Inpatient Diabetes Program Recommendations:     Insulin: Please consider increasing Levemir to 10 units daily and ordering Novolog 3 units TID with meals for meal coverage if patient eats at least 50% of meals.   HbgA1C:  A1C 9.6% on 02/26/22 indicating average glucose of 229 mg/dl over the past 2-3 months.   Outpatient DM medications: At time of discharge, would recommend discharging patient on same insulin regimen as being used as inpatient.   Thanks, Barnie Alderman, RN, MSN, [place] [facility] Coordinator Inpatient Diabetes Program [phone] (Team Pager from 8am to [place])

## 2022-03-02 NOTE — Plan of Care (Signed)
  Problem: Clinical Measurements: Goal: Ability to maintain clinical measurements within normal limits will improve Outcome: Progressing Goal: Cardiovascular complication will be avoided Outcome: Progressing   Problem: Activity: Goal: Risk for activity intolerance will decrease Outcome: Progressing   Problem: Nutrition: Goal: Adequate nutrition will be maintained Outcome: Progressing   Problem: Coping: Goal: Level of anxiety will decrease Outcome: Progressing   Problem: Elimination: Goal: Will not experience complications related to bowel motility Outcome: Progressing Goal: Will not experience complications related to urinary retention Outcome: Progressing   Problem: Safety: Goal: Ability to remain free from injury will improve Outcome: Progressing

## 2022-03-03 DIAGNOSIS — W19XXXA Unspecified fall, initial encounter: Secondary | ICD-10-CM | POA: Diagnosis not present

## 2022-03-03 DIAGNOSIS — F0390 Unspecified dementia without behavioral disturbance: Secondary | ICD-10-CM | POA: Diagnosis not present

## 2022-03-03 DIAGNOSIS — I10 Essential (primary) hypertension: Secondary | ICD-10-CM | POA: Diagnosis not present

## 2022-03-03 DIAGNOSIS — S72001A Fracture of unspecified part of neck of right femur, initial encounter for closed fracture: Secondary | ICD-10-CM | POA: Diagnosis not present

## 2022-03-03 LAB — GLUCOSE, CAPILLARY
Glucose-Capillary: 172 mg/dL — ABNORMAL HIGH (ref 70–99)
Glucose-Capillary: 228 mg/dL — ABNORMAL HIGH (ref 70–99)

## 2022-03-03 MED ORDER — INSULIN ASPART 100 UNIT/ML IJ SOLN: 3.0000 [IU] | Freq: Three times a day (TID) | INTRAMUSCULAR | 11 refills | Status: AC

## 2022-03-03 MED ORDER — INSULIN ASPART 100 UNIT/ML IJ SOLN: 0.0000 [IU] | Freq: Three times a day (TID) | INTRAMUSCULAR | 11 refills | Status: AC

## 2022-03-03 MED ORDER — INSULIN DETEMIR 100 UNIT/ML ~~LOC~~ SOLN
10.0000 [IU] | Freq: Two times a day (BID) | SUBCUTANEOUS | Status: DC
  Filled 2022-03-03: qty 0.1

## 2022-03-03 MED ORDER — ADULT MULTIVITAMIN W/MINERALS CH: 1.0000 | ORAL_TABLET | Freq: Every day | ORAL | Status: AC

## 2022-03-03 MED ORDER — FERROUS SULFATE 325 (65 FE) MG PO TABS: 325.0000 mg | ORAL_TABLET | Freq: Every day | ORAL | 3 refills | Status: AC

## 2022-03-03 MED ORDER — GLUCERNA SHAKE PO LIQD: 237.0000 mL | Freq: Three times a day (TID) | ORAL | 0 refills | Status: AC

## 2022-03-03 MED ORDER — INSULIN DETEMIR 100 UNIT/ML ~~LOC~~ SOLN: 10.0000 [IU] | Freq: Two times a day (BID) | SUBCUTANEOUS | 11 refills | Status: AC

## 2022-03-03 MED ORDER — MAGNESIUM HYDROXIDE 400 MG/5ML PO SUSP: 30.0000 mL | Freq: Every day | ORAL | 0 refills | Status: AC | PRN

## 2022-03-03 MED ORDER — HYDROCODONE-ACETAMINOPHEN 5-325 MG PO TABS: 1.0000 | ORAL_TABLET | ORAL | 0 refills | Status: AC | PRN

## 2022-03-03 NOTE — Discharge Summary (Signed)
Physician Discharge Summary   Patient: Sonya Jones MRN: [id number] DOB: 22-Feb-1933  Admit date:     02/26/2022  Discharge date: 03/03/22  Discharge Physician: Lorella Nimrod   PCP: Francis Gaines, FNP   Recommendations at discharge:  Please obtain CBC and BMP in 1 week Patient is being discharged with Foley catheter due to acute urinary retention, need to have a follow-up with urology in 1 to 2 weeks for voiding trial. Follow-up with orthopedic surgery Follow-up with primary care provider  Discharge Diagnoses: Principal Problem:   Closed right hip fracture ([place]) Active Problems:   Uncontrolled type 2 diabetes mellitus with hyperglycemia, without long-term current use of insulin ([place])   Essential hypertension   Dementia without behavioral disturbance ([place])   History of DVT (deep vein thrombosis)   Pressure injury of skin   [facility] Course: 88 year old with past medical history significant for diabetes type 2, vascular dementia, DVT on Eliquis presented to the ED from SNF with acute onset of accidental mechanical fall while she was bending over to pick up a book, subsequently fell on her right side and hit her right hip.  CT head negative for acute intracranial abnormality.  Chest x-ray no acute cardiopulmonary disease.  Right hip CT revealed acute mildly impacted subcapital right femoral neck fracture with normal femoral head alignment.  5.4 cm hematoma within the subcutaneous soft tissue of the lateral right hip.   Orthopedic was consulted and patient underwent percutaneous fixation of the right femoral neck with four 6.5 millimeter screw on 02/27/2022 by Dr. Sabra Heck.  PT/OT recommended SNF where she is being discharged for rehab. Patient also developed acute urinary retention, Foley catheter was placed after trying in and out catheter few times and her inability to void. Patient is being discharged with Foley catheter and need to see a urologist as an outpatient for voiding  trial and further management.  Patient need to follow-up with orthopedic surgery in 2 weeks.  She will continue on her current medications and need to have a follow-up with her providers.  Assessment and Plan: * Closed right hip fracture [facility]) The patient will be- She will be admitted to a medical-surgical bed. - Pain management will be provided. - Eliquis will be held off. - Orthopedic consult will be obtained. - Dr. Sabra Heck was notified about the patient. - She has no history of diabetes mellitus on insulin, CVA, CHF, coronary artery disease, or renal failure with a creatinine more than 2.  She is considered average risk for her age for perioperative cardiovascular events per the revised cardiac risk index.  She has no current pulmonary issues. -She will be kept n.p.o. after midnight for potential simple percutaneous pinning that is intended by Dr. Sabra Heck. - Beta-blocker therapy that should provide perioperative cardiovascular risk reduction, will be resumed.  Uncontrolled type 2 diabetes mellitus with hyperglycemia, without long-term current use of insulin ([place]) - The patient will be placed on supplemental coverage with NovoLog. - We will hold off metformin.  History of DVT (deep vein thrombosis) - We will have to temporarily hold off Eliquis for now for expected operative intervention tomorrow. - We will defer resumption of Eliquis to Dr. Ammie Ferrier discretion, especially given recent onset of acute DVT.Marland Kitchen  Dementia without behavioral disturbance ([place]) - We will continue Aricept and Namenda.  Essential hypertension We will continue- We will continue her antihypertensives.         Consultants: Orthopedic surgery Procedures performed: ORIF Disposition: Skilled nursing facility Diet recommendation:  Discharge Diet Orders (From admission, onward)     Start     Ordered   03/03/22 0000  Diet - low sodium heart healthy        03/03/22 1355           Cardiac and Carb  modified diet DISCHARGE MEDICATION: Allergies as of 03/03/2022   No Known Allergies      Medication List     STOP taking these medications    lisinopril 5 MG tablet Commonly known as: ZESTRIL   metFORMIN 500 MG tablet Commonly known as: GLUCOPHAGE       TAKE these medications    acetaminophen 325 MG tablet Commonly known as: TYLENOL Take 650 mg by mouth every 6 (six) hours as needed.   apixaban 5 MG Tabs tablet Commonly known as: ELIQUIS Take 1 tablet by mouth in the morning and at bedtime.   donepezil 5 MG tablet Commonly known as: ARICEPT Take 1 tablet by mouth at bedtime.   feeding supplement (GLUCERNA SHAKE) Liqd Take 237 mLs by mouth 3 (three) times daily between meals.   ferrous sulfate 325 (65 FE) MG tablet Take 1 tablet (325 mg total) by mouth daily with breakfast. Start taking on: March 04, 2022   HYDROcodone-acetaminophen 5-325 MG tablet Commonly known as: NORCO/VICODIN Take 1-2 tablets by mouth every 4 (four) hours as needed for moderate pain (pain score 4-6).   insulin aspart 100 UNIT/ML injection Commonly known as: novoLOG Inject 3 Units into the skin 3 (three) times daily with meals.   insulin aspart 100 UNIT/ML injection Commonly known as: novoLOG Inject 0-9 Units into the skin 3 (three) times daily with meals.   insulin detemir 100 UNIT/ML injection Commonly known as: LEVEMIR Inject 0.1 mLs (10 Units total) into the skin 2 (two) times daily.   magnesium hydroxide 400 MG/5ML suspension Commonly known as: MILK OF MAGNESIA Take 30 mLs by mouth daily as needed for mild constipation.   memantine 5 MG tablet Commonly known as: NAMENDA Take 1 tablet by mouth in the morning and at bedtime.   metoprolol tartrate 25 MG tablet Commonly known as: LOPRESSOR Take 1 tablet by mouth in the morning and at bedtime.   multivitamin with minerals Tabs tablet Take 1 tablet by mouth daily. Start taking on: March 04, 2022   Vitamin D3 50 MCG (2000  UT) Tabs Take 1 tablet by mouth daily.               Discharge Care Instructions  (From admission, onward)           Start     Ordered   03/03/22 0000  Leave dressing on - Keep it clean, dry, and intact until clinic visit        03/03/22 1355            Contact information for follow-up providers     Earnestine Leys, MD. Schedule an appointment as soon as possible for a visit in 2 week(s).   Specialty: Orthopedic Surgery Why: For wound re-check Please make appointment for patient prior to her discharge from the hospital Contact information: [place] [address] [phone]         Francis Gaines, [place]. Schedule an appointment as soon as possible for a visit in 1 week(s).   Specialty: Family Medicine Contact information: [place] [address] [phone]              Contact information for after-discharge care  Destination     [facility] [place] Preferred SNF .   Service: Skilled Nursing Contact information: [address] [place] [phone]                    Discharge Exam: Danley Danker Weights   03/02/22 1200 03/03/22 0500  Weight: 66.7 kg 65.3 kg   General.  Frail elderly lady, in no acute distress. Pulmonary.  Lungs clear bilaterally, normal respiratory effort. CV.  Regular rate and rhythm, no JVD, rub or murmur. Abdomen.  Soft, nontender, nondistended, BS positive. CNS.  Alert and oriented to self only.  No focal neurologic deficit. Extremities.  No edema, no cyanosis, pulses intact and symmetrical. Psychiatry.  Judgment and insight appears impaired.  Condition at discharge: stable  The results of significant diagnostics from this hospitalization (including imaging, microbiology, ancillary and laboratory) are listed below for reference.   Imaging Studies: DG HIP UNILAT WITH PELVIS 2-3 VIEWS RIGHT  Result Date: 02/27/2022 CLINICAL DATA:   Surgical internal fixation of right hip fracture. EXAM: DG HIP (WITH OR WITHOUT PELVIS) 2-3V RIGHT; DG C-ARM 1-60 MIN-NO REPORT Fluoroscopy time: 59 seconds. COMPARISON:  February 26, 2022. FINDINGS: Two intraoperative fluoroscopic images were obtained of the right hip. These images demonstrate surgical internal fixation of proximal right femoral fracture. IMPRESSION: Fluoroscopic guidance provided during surgical internal fixation of proximal right femoral fracture. Electronically Signed   By: Marijo Conception M.D.   On: 02/27/2022 12:14   DG C-Arm 1-60 Min-No Report  Result Date: 02/27/2022 Fluoroscopy was utilized by the requesting physician.  No radiographic interpretation.   CT Hip Right Wo Contrast  Result Date: 02/26/2022 CLINICAL DATA:  Mechanical fall EXAM: CT OF THE RIGHT HIP WITHOUT CONTRAST TECHNIQUE: Multidetector CT imaging of the right hip was performed according to the standard protocol. Multiplanar CT image reconstructions were also generated. RADIATION DOSE REDUCTION: This exam was performed according to the departmental dose-optimization program which includes automated exposure control, adjustment of the mA and/or kV according to patient size and/or use of iterative reconstruction technique. COMPARISON:  Radiograph 02/26/2022 FINDINGS: Bones/Joint/Cartilage Acute mildly impacted subcapital right femoral neck fracture. No femoral head dislocation. Mild degenerative change of the right hip with joint space narrowing and spurring. No sizable hip effusion. Ligaments Suboptimally assessed by CT. Muscles and Tendons No intramuscular collections.  No significant atrophy. Soft tissues Edema within the right hip soft tissues. Slightly dense masses within the subcutaneous soft tissues of the lateral right hip measuring 5.4 by 4 cm consistent with hematoma. IMPRESSION: 1. Acute mildly impacted subcapital right femoral neck fracture with normal femoral head alignment 2. 5.4 cm hematoma within the  subcutaneous soft tissues of the lateral right hip. Electronically Signed   By: Donavan Foil M.D.   On: 02/26/2022 19:46   DG Hip Unilat W or Wo Pelvis 2-3 Views Right  Result Date: 02/26/2022 CLINICAL DATA:  Fall.  Evaluate for right hip fracture. EXAM: DG HIP (WITH OR WITHOUT PELVIS) 2-3V RIGHT COMPARISON:  CT abdomen and pelvis 02/16/2022 FINDINGS: There is diffuse decreased bone mineralization. Moderate bilateral sacroiliac subchondral sclerosis. Mild bilateral superior femoroacetabular joint space narrowing and peripheral acetabular degenerative osteophytes. There is new acute angulation and mild cortical step-off of the lateral right femoral head-neck junction indicating an acute proximal femoral neck fracture. Minimal medial displacement of the right femoral neck with respect of the right femoral head. Severe right and moderate left L4-5 disc space narrowing and peripheral endplate osteophytes. IMPRESSION: 1. Acute,  mildly displaced right femoral neck fracture. 2. Severe right and moderate left L4-5 degenerative disc and facet changes. Electronically Signed   By: Yvonne Kendall M.D.   On: 02/26/2022 17:05   DG Wrist Complete Left  Result Date: 02/26/2022 CLINICAL DATA:  Fall EXAM: LEFT WRIST - COMPLETE 3+ VIEW COMPARISON:  None Available. FINDINGS: Advanced degenerative changes at the 1st carpometacarpal joint. Moderate arthritic changes in the radiocarpal joint. No acute bony abnormality. Specifically, no fracture, subluxation, or dislocation. IMPRESSION: No acute bony abnormality. Electronically Signed   By: Rolm Baptise M.D.   On: 02/26/2022 17:05   DG Chest 1 View  Result Date: 02/26/2022 CLINICAL DATA:  Fall, right hip fracture EXAM: CHEST  1 VIEW COMPARISON:  02/16/2022 FINDINGS: Heart and mediastinal contours are within normal limits. No focal opacities or effusions. No acute bony abnormality. IMPRESSION: No active disease. Electronically Signed   By: Rolm Baptise M.D.   On: 02/26/2022  17:04   CT HEAD WO CONTRAST (5MM)  Result Date: 02/26/2022 CLINICAL DATA:  Dementia, fall EXAM: CT HEAD WITHOUT CONTRAST CT CERVICAL SPINE WITHOUT CONTRAST TECHNIQUE: Multidetector CT imaging of the head and cervical spine was performed following the standard protocol without intravenous contrast. Multiplanar CT image reconstructions of the cervical spine were also generated. RADIATION DOSE REDUCTION: This exam was performed according to the departmental dose-optimization program which includes automated exposure control, adjustment of the mA and/or kV according to patient size and/or use of iterative reconstruction technique. COMPARISON:  CT brain 02/16/2022 FINDINGS: CT HEAD FINDINGS Brain: No acute territorial infarction, hemorrhage or intracranial mass. Atrophy. Patchy white matter hypodensity consistent with chronic small vessel ischemic change. Stable ventricle size. Vascular: No hyperdense vessels. Vertebral and carotid vascular calcification Skull: Normal. Negative for fracture or focal lesion. Sinuses/Orbits: No acute finding. Other: None CT CERVICAL SPINE FINDINGS Alignment: No subluxation.  Facet alignment is within normal limits. Skull base and vertebrae: No acute fracture. No primary bone lesion or focal pathologic process. Soft tissues and spinal canal: No prevertebral fluid or swelling. No visible canal hematoma. Disc levels: Multilevel degenerative change. Moderate severe disc space narrowing C5-C6 and C6-C7 with moderate disc space narrowing at C7-T1. Hypertrophic facet degenerative changes left greater than right at multiple levels with foraminal narrowing. Upper chest: Negative. Other: None IMPRESSION: 1. No CT evidence for acute intracranial abnormality. Atrophy and chronic small vessel ischemic changes of the white matter 2. Multilevel degenerative changes of the cervical spine. No acute osseous abnormality Electronically Signed   By: Donavan Foil M.D.   On: 02/26/2022 16:46   CT  Cervical Spine Wo Contrast  Result Date: 02/26/2022 CLINICAL DATA:  Dementia, fall EXAM: CT HEAD WITHOUT CONTRAST CT CERVICAL SPINE WITHOUT CONTRAST TECHNIQUE: Multidetector CT imaging of the head and cervical spine was performed following the standard protocol without intravenous contrast. Multiplanar CT image reconstructions of the cervical spine were also generated. RADIATION DOSE REDUCTION: This exam was performed according to the departmental dose-optimization program which includes automated exposure control, adjustment of the mA and/or kV according to patient size and/or use of iterative reconstruction technique. COMPARISON:  CT brain 02/16/2022 FINDINGS: CT HEAD FINDINGS Brain: No acute territorial infarction, hemorrhage or intracranial mass. Atrophy. Patchy white matter hypodensity consistent with chronic small vessel ischemic change. Stable ventricle size. Vascular: No hyperdense vessels. Vertebral and carotid vascular calcification Skull: Normal. Negative for fracture or focal lesion. Sinuses/Orbits: No acute finding. Other: None CT CERVICAL SPINE FINDINGS Alignment: No subluxation.  Facet alignment is within normal limits. Skull  base and vertebrae: No acute fracture. No primary bone lesion or focal pathologic process. Soft tissues and spinal canal: No prevertebral fluid or swelling. No visible canal hematoma. Disc levels: Multilevel degenerative change. Moderate severe disc space narrowing C5-C6 and C6-C7 with moderate disc space narrowing at C7-T1. Hypertrophic facet degenerative changes left greater than right at multiple levels with foraminal narrowing. Upper chest: Negative. Other: None IMPRESSION: 1. No CT evidence for acute intracranial abnormality. Atrophy and chronic small vessel ischemic changes of the white matter 2. Multilevel degenerative changes of the cervical spine. No acute osseous abnormality Electronically Signed   By: Donavan Foil M.D.   On: 02/26/2022 16:46     Microbiology: Results for orders placed or performed during the hospital encounter of 02/26/22  Resp Panel by RT-PCR (Flu A&B, Covid) Anterior Nasal Swab     Status: None   Collection Time: 02/26/22  4:11 PM   Specimen: Anterior Nasal Swab  Result Value Ref Range Status   SARS Coronavirus 2 by RT PCR NEGATIVE NEGATIVE Final    Comment: (NOTE) SARS-CoV-2 target nucleic acids are NOT DETECTED.  The SARS-CoV-2 RNA is generally detectable in upper respiratory specimens during the acute phase of infection. The lowest concentration of SARS-CoV-2 viral copies this assay can detect is 138 copies/mL. A negative result does not preclude SARS-Cov-2 infection and should not be used as the sole basis for treatment or other patient management decisions. A negative result may occur with  improper specimen collection/handling, submission of specimen other than nasopharyngeal swab, presence of viral mutation(s) within the areas targeted by this assay, and inadequate number of viral copies(<138 copies/mL). A negative result must be combined with clinical observations, patient history, and epidemiological information. The expected result is Negative.  Fact Sheet for Patients:  EntrepreneurPulse.com.au  Fact Sheet for Healthcare Providers:  IncredibleEmployment.be  This test is no t yet approved or cleared by the [place] FDA and  has been authorized for detection and/or diagnosis of SARS-CoV-2 by FDA under an Emergency Use Authorization (EUA). This EUA will remain  in effect (meaning this test can be used) for the duration of the COVID-19 declaration under Section 564(b)(1) of the Act, 21 U.S.C.section 360bbb-3(b)(1), unless the authorization is terminated  or revoked sooner.       Influenza A by PCR NEGATIVE NEGATIVE Final   Influenza B by PCR NEGATIVE NEGATIVE Final    Comment: (NOTE) The Xpert Xpress SARS-CoV-2/FLU/RSV plus assay is intended as an  aid in the diagnosis of influenza from Nasopharyngeal swab specimens and should not be used as a sole basis for treatment. Nasal washings and aspirates are unacceptable for Xpert Xpress SARS-CoV-2/FLU/RSV testing.  Fact Sheet for Patients: EntrepreneurPulse.com.au  Fact Sheet for Healthcare Providers: IncredibleEmployment.be  This test is not yet approved or cleared by the [place] FDA and has been authorized for detection and/or diagnosis of SARS-CoV-2 by FDA under an Emergency Use Authorization (EUA). This EUA will remain in effect (meaning this test can be used) for the duration of the COVID-19 declaration under Section 564(b)(1) of the Act, 21 U.S.C. section 360bbb-3(b)(1), unless the authorization is terminated or revoked.  Performed at [facility], [address] [place], [place] [address]   MRSA Next Gen by PCR, Nasal     Status: None   Collection Time: 02/27/22  6:42 AM   Specimen: Nasal Mucosa; Nasal Swab  Result Value Ref Range Status   MRSA by PCR Next Gen NOT DETECTED NOT DETECTED Final  Comment: (NOTE) The GeneXpert MRSA Assay (FDA approved for NASAL specimens only), is one component of a comprehensive MRSA colonization surveillance program. It is not intended to diagnose MRSA infection nor to guide or monitor treatment for MRSA infections. Test performance is not FDA approved in patients less than 72 years old. Performed at [facility], [address] [place],  [address]     Labs: CBC: Recent Labs  Lab 02/26/22 1740 02/28/22 0604 03/01/22 0242 03/02/22 0554  WBC 9.4 9.7 8.9 7.0  NEUTROABS 7.9*  --   --   --   HGB 12.9 9.1* 9.4* 9.8*  HCT 39.4 26.9* 28.5* 29.7*  MCV 90.8 89.1 89.1 88.4  PLT 221 195 231 [id number]   Basic Metabolic Panel: Recent Labs  Lab 02/26/22 1820 02/28/22 0604 03/01/22 0242 03/02/22 0554  NA 137 139 138 135  K 3.6 3.9 4.1 4.0  CL 106 114* 107 109  CO2  20* 20* 22 22  GLUCOSE 343* 220* 233* 192*  BUN 32* 26* 37* 26*  CREATININE 1.26* 1.13* 1.35* 1.07*  CALCIUM 9.2 7.9* 8.1* 8.0*   Liver Function Tests: No results for input(s): "AST", "ALT", "ALKPHOS", "BILITOT", "PROT", "ALBUMIN" in the last 168 hours. CBG: Recent Labs  Lab 03/02/22 1216 03/02/22 1654 03/02/22 2058 03/03/22 0827 03/03/22 1154  GLUCAP 296* 261* 330* 172* 228*    Discharge time spent: greater than 30 minutes.  This record has been created using Systems analyst. Errors have been sought and corrected,but may not always be located. Such creation errors do not reflect on the standard of care.   Signed: Lorella Nimrod, MD [facility] 03/03/2022

## 2022-03-03 NOTE — Progress Notes (Addendum)
       CROSS COVER NOTE  NAME: Sonya Jones MRN: [id number] DOB : July 10, 1932 ATTENDING PHYSICIAN: Elmarie Shiley, MD    Date of Service   03/03/2022   HPI/Events of Note   Notified of Bladder scan >930mL. M(r)s Sonya Jones was also unable to void this morning and required In and Out catherization at that time.  Interventions   Assessment/Plan:  Acute Urinary Retention In and Out Cath--> Second occurrence. Recommend foley if continues to retain    0618: Pt bladder scanned again with reading of 969mL. Foley ordered.    This document was prepared using Dragon voice recognition software and may include unintentional dictation errors.  Sonya Glass DNP, MBA, FNP-BC Nurse Practitioner [facility] Pager [phone]

## 2022-03-03 NOTE — Progress Notes (Signed)
Iv removed site c/d/I, pt transported [facility] via EMS report called to Nurse at facility, pt went with foley catheter and all belongings.

## 2022-03-03 NOTE — Progress Notes (Signed)
Subjective: 4 Days Post-Op Procedure(s) (LRB): PERCUTANEOUS FIXATION OF FEMORAL NECK (Right)   Patient is up in a chair at the nurses station for safety reasons.  She is very confused and gets up and walks on the hip if not monitored closely.  Hemoglobin is stable.  Dressings are dry.  Little to no pain.  Patient reports pain as mild.  Objective:   VITALS:   Vitals:   03/02/22 1459 03/03/22 0804  BP: 129/80 (!) 148/62  Pulse: 72 77  Resp: 17 16  Temp: 98.1 F (36.7 C) 97.6 F (36.4 C)  SpO2: 100% 97%    Neurologically intact Incision: dressing C/D/I  LABS Recent Labs    03/01/22 0242 03/02/22 0554  HGB 9.4* 9.8*  HCT 28.5* 29.7*  WBC 8.9 7.0  PLT 231 275    Recent Labs    03/01/22 0242 03/02/22 0554  NA 138 135  K 4.1 4.0  BUN 37* 26*  CREATININE 1.35* 1.07*  GLUCOSE 233* 192*    No results for input(s): "LABPT", "INR" in the last 72 hours.   Assessment/Plan: 4 Days Post-Op Procedure(s) (LRB): PERCUTANEOUS FIXATION OF FEMORAL NECK (Right)   Advance diet Up with therapy Discharge to SNF  Toe-touch only on the right leg if possible.  Return to clinic 2 weeks for exam and x-rays

## 2022-03-03 NOTE — Progress Notes (Signed)
Physical Therapy Treatment Patient Details Name: Sonya Jones MRN: [id number] DOB: [birth year] Today's Date: 03/03/2022   History of Present Illness Pt is an 88 year old female s/p percutaneous fixation of the right femoral neck with four 6.5 millimeter screw 02/27/22 after mechanical fall. PMH significant for diabetes type 2, vascular dementia, DVT on Eliquis    PT Comments    Pt seen after lunch, received at nursing station, agreeable to PT. Pt completed ROM/strengthening exercises in sitting. Pt educated on weight bearing restrictions with good understanding, however once standing at RW, pt unable to maintain TTWB, therefore unable to progress gait. Pt able to maintain static standing at RW with CGA for 2 minute increments prior to fatiguing.  Continue to recommend SNF once medically stable.     Recommendations for follow up therapy are one component of a multi-disciplinary discharge planning process, led by the attending physician.  Recommendations may be updated based on patient status, additional functional criteria and insurance authorization.  Follow Up Recommendations  Skilled nursing-short term rehab (<3 hours/day) Can patient physically be transported by private vehicle: Yes   Assistance Recommended at Discharge Frequent or constant Supervision/Assistance  Patient can return home with the following A lot of help with walking and/or transfers;A lot of help with bathing/dressing/bathroom;Assist for transportation   Equipment Recommendations  None recommended by PT    Recommendations for Other Services       Precautions / Restrictions Precautions Precautions: Fall Restrictions Weight Bearing Restrictions: Yes RLE Weight Bearing: Touchdown weight bearing     Mobility  Bed Mobility               General bed mobility comments: not tested, pt received & left in recliner    Transfers Overall transfer level: Needs assistance Equipment used: Rolling walker (2  wheels) Transfers: Sit to/from Stand Sit to Stand: Min guard, Min assist           General transfer comment: Assistance given to prevent weight bearing through R LE upon standing    Ambulation/Gait               General Gait Details: Pt unable to progress with gait training due to inability to comply with wt bearing restrictions   Stairs             Wheelchair Mobility    Modified Rankin (Stroke Patients Only)       Balance Overall balance assessment: Needs assistance Sitting-balance support: Feet supported, Bilateral upper extremity supported Sitting balance-Leahy Scale: Good Sitting balance - Comments: No LOB   Standing balance support: Reliant on assistive device for balance, Bilateral upper extremity supported Standing balance-Leahy Scale: Poor                              Cognition Arousal/Alertness: Awake/alert Behavior During Therapy: WFL for tasks assessed/performed Overall Cognitive Status: History of cognitive impairments - at baseline Area of Impairment: Orientation, Memory, Safety/judgement, Awareness                 Orientation Level: Disoriented to, Place, Time   Memory: Decreased recall of precautions, Decreased short-term memory Following Commands: Follows one step commands inconsistently, Follows one step commands with increased time Safety/Judgement: Decreased awareness of deficits, Decreased awareness of safety Awareness: Intellectual Problem Solving: Requires verbal cues, Requires tactile cues, Decreased initiation, Slow processing General Comments: Repeated vc's to stay on task, very willing to participate  Exercises General Exercises - Lower Extremity Ankle Circles/Pumps: AROM, Both, 15 reps Long Arc Quad: AROM, Strengthening, Seated, 10 reps, Both Hip ABduction/ADduction: AAROM, Strengthening, Right, 10 reps Hip Flexion/Marching: AROM, Strengthening, Seated, 15 reps, Both    General Comments  General comments (skin integrity, edema, etc.):  (Pt pleasantly confused without c/o pain throughout session)      Pertinent Vitals/Pain Pain Assessment Pain Assessment: No/denies pain    Home Living                          Prior Function            PT Goals (current goals can now be found in the care plan section) Acute Rehab PT Goals Patient Stated Goal: none stated    Frequency    BID      PT Plan Current plan remains appropriate    Co-evaluation              AM-PAC PT "6 Clicks" Mobility   Outcome Measure  Help needed turning from your back to your side while in a flat bed without using bedrails?: None Help needed moving from lying on your back to sitting on the side of a flat bed without using bedrails?: A Little Help needed moving to and from a bed to a chair (including a wheelchair)?: A Little Help needed standing up from a chair using your arms (e.g., wheelchair or bedside chair)?: A Little Help needed to walk in hospital room?: A Lot Help needed climbing 3-5 steps with a railing? : Total 6 Click Score: 16    End of Session Equipment Utilized During Treatment: Gait belt Activity Tolerance: Patient tolerated treatment well Patient left: with chair alarm set;in chair;with call bell/phone within reach Nurse Communication: Mobility status PT Visit Diagnosis: Unsteadiness on feet (R26.81);Other abnormalities of gait and mobility (R26.89);Difficulty in walking, not elsewhere classified (R26.2)     Time: 3790-2409 PT Time Calculation (min) (ACUTE ONLY): 25 min  Charges:  $Therapeutic Exercise: 8-22 mins $Therapeutic Activity: 8-22 mins                    Zadie Cleverly, PTA    Jannet Askew 03/03/2022, 1:34 PM

## 2022-03-03 NOTE — Plan of Care (Signed)
  Problem: Education: Goal: Knowledge of General Education information will improve Description: Including pain rating scale, medication(s)/side effects and non-pharmacologic comfort measures Outcome: Progressing   Problem: Health Behavior/Discharge Planning: Goal: Ability to manage health-related needs will improve Outcome: Progressing   Problem: Clinical Measurements: Goal: Ability to maintain clinical measurements within normal limits will improve Outcome: Progressing Goal: Will remain free from infection Outcome: Progressing Goal: Diagnostic test results will improve Outcome: Progressing Goal: Respiratory complications will improve Outcome: Progressing Goal: Cardiovascular complication will be avoided Outcome: Progressing   Problem: Activity: Goal: Risk for activity intolerance will decrease Outcome: Progressing   Problem: Nutrition: Goal: Adequate nutrition will be maintained Outcome: Progressing   Problem: Coping: Goal: Level of anxiety will decrease Outcome: Progressing   Problem: Elimination: Goal: Will not experience complications related to bowel motility Outcome: Progressing Goal: Will not experience complications related to urinary retention Outcome: Progressing   Problem: Pain Managment: Goal: General experience of comfort will improve Outcome: Progressing   Problem: Safety: Goal: Ability to remain free from injury will improve Outcome: Progressing   Problem: Skin Integrity: Goal: Risk for impaired skin integrity will decrease Outcome: Progressing   Problem: Education: Goal: Ability to describe self-care measures that may prevent or decrease complications (Diabetes Survival Skills Education) will improve Outcome: Progressing Goal: Individualized Educational Video(s) Outcome: Progressing   Problem: Coping: Goal: Ability to adjust to condition or change in health will improve Outcome: Progressing   Problem: Fluid Volume: Goal: Ability to  maintain a balanced intake and output will improve Outcome: Progressing   Problem: Health Behavior/Discharge Planning: Goal: Ability to identify and utilize available resources and services will improve Outcome: Progressing Goal: Ability to manage health-related needs will improve Outcome: Progressing   Problem: Metabolic: Goal: Ability to maintain appropriate glucose levels will improve Outcome: Progressing   Problem: Nutritional: Goal: Maintenance of adequate nutrition will improve Outcome: Progressing Goal: Progress toward achieving an optimal weight will improve Outcome: Progressing   Problem: Skin Integrity: Goal: Risk for impaired skin integrity will decrease Outcome: Progressing   Problem: Tissue Perfusion: Goal: Adequacy of tissue perfusion will improve Outcome: Progressing   Problem: Education: Goal: Verbalization of understanding the information provided (i.e., activity precautions, restrictions, etc) will improve Outcome: Progressing Goal: Individualized Educational Video(s) Outcome: Progressing   Problem: Activity: Goal: Ability to ambulate and perform ADLs will improve Outcome: Progressing   Problem: Clinical Measurements: Goal: Postoperative complications will be avoided or minimized Outcome: Progressing   Problem: Self-Concept: Goal: Ability to maintain and perform role responsibilities to the fullest extent possible will improve Outcome: Progressing   Problem: Pain Management: Goal: Pain level will decrease Outcome: Progressing   

## 2022-03-03 NOTE — Progress Notes (Signed)
Pt still has urinary retention, bladder scan shows 903, bladder distended. Consulted hospitalist. Foley cath ordered, inserted asceptically with RN and NT 3. Pt tolerated procedure well.

## 2022-03-03 NOTE — TOC Progression Note (Addendum)
Transition of Care [facility]) - Progression Note    Patient Details  Name: Sonya Jones MRN: [id number] Date of Birth: 11-May-1932  [facility]) CM/SW [place], RN Phone Number: 03/03/2022, 2:40 PM  Clinical Narrative:     Spoke with the daughter Sonya Jones, she stated [place] daughter is on a cruise is unavailable I notified her that the patient will go to room 603 today at [facility], she stated understanding EMS called, 3rd on list  Expected Discharge Plan: [place] Barriers to Discharge: Continued Medical Work up  Expected Discharge Plan and Services Expected Discharge Plan: [place] In-house Referral: Clinical Social Work   Post Acute Care Choice: [place] Living arrangements for the past 2 months: [place] Expected Discharge Date: 03/03/22                                     Social Determinants of Health (SDOH) Interventions    Readmission Risk Interventions     No data to display

## 2022-03-03 NOTE — Inpatient Diabetes Management (Signed)
Inpatient Diabetes Program Recommendations  AACE/ADA: New Consensus Statement on Inpatient Glycemic Control   Target Ranges:  Prepandial:   less than 140 mg/dL      [facility] postprandial:   less than 180 mg/dL (1-2 hours)      Critically ill patients:  140 - 180 mg/dL     Latest Reference Range & Units 03/02/22 08:08 03/02/22 12:16 03/02/22 16:54 03/02/22 20:58 03/03/22 08:27  Glucose-Capillary 70 - 99 mg/dL 180 (H) 296 (H) 261 (H) 330 (H) 172 (H)   Review of Glycemic Control  Diabetes history: DM2 Outpatient Diabetes medications: Metformin 500 mg BID Current orders for Inpatient glycemic control: Levemir 10 units daily, Novolog 0-9 units TID with meals, Novolog 0-5 units QHS, Novolog 3 units TID with meals   Inpatient Diabetes Program Recommendations:     Insulin: Please consider increasing Levemir to 11 units daily meal coverage to Novolog 5 units TID with meals if patient eats at least 50% of meals.   HbgA1C:  A1C 9.6% on 02/26/22 indicating average glucose of 229 mg/dl over the past 2-3 months.   Outpatient DM medications: At time of discharge, would recommend discharging patient on same insulin regimen as being used as inpatient.   Thanks, Barnie Alderman, RN, MSN, [place] Diabetes Coordinator Inpatient Diabetes Program [phone] (Team Pager from 8am to [place])

## 2022-03-03 NOTE — Progress Notes (Signed)
PT Cancellation Note  Patient Details Name: Sonya Jones MRN: [id number] DOB: 10-07-1932   Cancelled Treatment:     Pt was eating breakfast upon am attempt, will re-attempt in pm.   [name] 03/03/2022, 12:40 PM

## 2022-03-03 NOTE — Progress Notes (Signed)
Physical Therapy Treatment Patient Details Name: Delora Gravatt MRN: [id number] DOB: Oct 22, 1932 Today's Date: 03/03/2022   History of Present Illness      PT Comments    Pt received in recliner at nursing station. Very pleasant and willing to participate. Pt able to transfer sit to stand with Mod/MinA to RW. Unable to comply with wt bearing restrictions in order to progress gait training and mobility. Session completed with B LE strengthening exercises in sitting. No verbal c/o pain with activity. Continue to recommend SNF once medically cleared for d/c.   Recommendations for follow up therapy are one component of a multi-disciplinary discharge planning process, led by the attending physician.  Recommendations may be updated based on patient status, additional functional criteria and insurance authorization.  Follow Up Recommendations        Assistance Recommended at Discharge    Patient can return home with the following     Equipment Recommendations       Recommendations for Other Services       Precautions / Restrictions       Mobility  Bed Mobility                    Transfers                        Ambulation/Gait                   Stairs             Wheelchair Mobility    Modified Rankin (Stroke Patients Only)       Balance                                            Cognition                                                Exercises      General Comments        Pertinent Vitals/Pain      Home Living                          Prior Function            PT Goals (current goals can now be found in the care plan section)      Frequency           PT Plan      Co-evaluation              AM-PAC PT "6 Clicks" Mobility   Outcome Measure                   End of Session               Time:  -     Charges:                        Mikel Cella, PTA    [name] 03/03/2022, 10:22 AM
